# Patient Record
Sex: Female | Born: 1975 | Hispanic: Yes | Marital: Married | Smoking: Former smoker
Health system: Southern US, Community
[De-identification: ages and names within clinical notes are randomized; demographics above are authoritative.]

## PROBLEM LIST (undated history)

## (undated) DIAGNOSIS — H538 Other visual disturbances: Secondary | ICD-10-CM

## (undated) DIAGNOSIS — E041 Nontoxic single thyroid nodule: Secondary | ICD-10-CM

## (undated) DIAGNOSIS — D649 Anemia, unspecified: Secondary | ICD-10-CM

## (undated) DIAGNOSIS — C73 Malignant neoplasm of thyroid gland: Secondary | ICD-10-CM

## (undated) DIAGNOSIS — G43909 Migraine, unspecified, not intractable, without status migrainosus: Secondary | ICD-10-CM

## (undated) HISTORY — DX: Anemia, unspecified: D64.9

## (undated) HISTORY — DX: Other visual disturbances: H53.8

## (undated) HISTORY — DX: Malignant neoplasm of thyroid gland: C73

---

## 2004-02-04 HISTORY — PX: OVARIAN CYST REMOVAL: SHX89

## 2011-11-14 DIAGNOSIS — D649 Anemia, unspecified: Secondary | ICD-10-CM | POA: Insufficient documentation

## 2012-04-30 DIAGNOSIS — D449 Neoplasm of uncertain behavior of unspecified endocrine gland: Secondary | ICD-10-CM | POA: Insufficient documentation

## 2012-10-10 ENCOUNTER — Emergency Department: Payer: Self-pay | Admitting: Emergency Medicine

## 2012-10-10 LAB — URINALYSIS, COMPLETE
Glucose,UR: NEGATIVE mg/dL (ref 0–75)
RBC,UR: 4 /HPF (ref 0–5)
WBC UR: 7 /HPF (ref 0–5)

## 2012-10-10 LAB — CBC
HCT: 40.9 % (ref 35.0–47.0)
HGB: 14.5 g/dL (ref 12.0–16.0)
MCHC: 35.5 g/dL (ref 32.0–36.0)
MCV: 85 fL (ref 80–100)
Platelet: 296 10*3/uL (ref 150–440)
RBC: 4.81 10*6/uL (ref 3.80–5.20)
RDW: 12.7 % (ref 11.5–14.5)

## 2012-10-10 LAB — BASIC METABOLIC PANEL
Anion Gap: 6 — ABNORMAL LOW (ref 7–16)
Calcium, Total: 8.8 mg/dL (ref 8.5–10.1)
EGFR (African American): >60
EGFR (Non-African Amer.): >60
Glucose: 98 mg/dL (ref 65–99)
Osmolality: 275 (ref 275–301)
Potassium: 3.2 mmol/L — ABNORMAL LOW (ref 3.5–5.1)
Sodium: 138 mmol/L (ref 136–145)

## 2012-10-10 LAB — MAGNESIUM: Magnesium: 1.9 mg/dL

## 2015-05-12 ENCOUNTER — Emergency Department: Payer: Self-pay

## 2015-05-12 ENCOUNTER — Emergency Department
Admission: EM | Admit: 2015-05-12 | Discharge: 2015-05-12 | Disposition: A | Discharge status: Home / Self Care | Payer: Self-pay | Attending: Emergency Medicine | Admitting: Emergency Medicine

## 2015-05-12 DIAGNOSIS — R51 Headache: Secondary | ICD-10-CM | POA: Insufficient documentation

## 2015-05-12 DIAGNOSIS — R519 Headache, unspecified: Secondary | ICD-10-CM

## 2015-05-12 LAB — POCT PREGNANCY, URINE: PREG TEST UR: NEGATIVE

## 2015-05-12 MED ORDER — KETOROLAC TROMETHAMINE 30 MG/ML IJ SOLN
30.0000 mg | Freq: Once | INTRAMUSCULAR | Status: AC
  Administered 2015-05-12: 30 mg via INTRAVENOUS

## 2015-05-12 MED ORDER — DIPHENHYDRAMINE HCL 50 MG/ML IJ SOLN
50.0000 mg | Freq: Once | INTRAMUSCULAR | Status: AC
  Administered 2015-05-12: 50 mg via INTRAVENOUS
  Filled 2015-05-12: qty 1

## 2015-05-12 MED ORDER — KETOROLAC TROMETHAMINE 30 MG/ML IJ SOLN
INTRAMUSCULAR | Status: AC
  Administered 2015-05-12: 30 mg via INTRAVENOUS
  Filled 2015-05-12: qty 1

## 2015-05-12 MED ORDER — MORPHINE SULFATE (PF) 2 MG/ML IV SOLN
2.0000 mg | Freq: Once | INTRAVENOUS | Status: AC
  Administered 2015-05-12: 2 mg via INTRAVENOUS
  Filled 2015-05-12: qty 1

## 2015-05-12 MED ORDER — SODIUM CHLORIDE 0.9 % IV BOLUS (SEPSIS)
1000.0000 mL | Freq: Once | INTRAVENOUS | Status: AC
  Administered 2015-05-12: 1000 mL via INTRAVENOUS

## 2015-05-12 MED ORDER — METOCLOPRAMIDE HCL 5 MG/ML IJ SOLN
10.0000 mg | Freq: Once | INTRAMUSCULAR | Status: AC
  Administered 2015-05-12: 10 mg via INTRAVENOUS
  Filled 2015-05-12: qty 2

## 2015-05-12 NOTE — ED Notes (Addendum)
Vomiting and headache that started last night. Denies fever  History of migraines. Also complains of light sensitivity. Tried her own medication with no relief.

## 2015-05-12 NOTE — ED Provider Notes (Signed)
CSN: [id number]     Arrival date & time 05/12/15  1610 History   First MD Initiated Contact with Patient 05/12/15 1650     Chief Complaint  Patient presents with  . Migraine     (Consider location/radiation/quality/duration/timing/severity/associated sxs/prior Treatment) HPI  40 year old female presents to emergency department for evaluation of headache. Patient states headache began several days ago but last night became severe. Patient has a history of migraine headaches. Today's headache is the worse headache she's ever had. Pain is throbbing on the right side of her head. She has photophobia with nausea and vomiting. She has not taken any medications for pain today. She has been treated in the past with headache medications that did not provide her with much relief. She denies any recent trauma or injury. No neck pain or fevers. No vision changes or temporal tenderness.  No past medical history on file. No past surgical history on file. No family history on file. Social History  Substance Use Topics  . Smoking status: Not on file  . Smokeless tobacco: Not on file  . Alcohol Use: Not on file   OB History    No data available     Review of Systems  Constitutional: Negative for fever, chills, activity change and fatigue.  HENT: Negative for congestion, sinus pressure and sore throat.   Eyes: Positive for photophobia. Negative for visual disturbance.  Respiratory: Negative for cough, chest tightness and shortness of breath.   Cardiovascular: Negative for chest pain and leg swelling.  Gastrointestinal: Positive for nausea and vomiting. Negative for abdominal pain and diarrhea.  Genitourinary: Negative for dysuria.  Musculoskeletal: Negative for arthralgias and gait problem.  Skin: Negative for rash.  Neurological: Negative for weakness and numbness.  Hematological: Negative for adenopathy.  Psychiatric/Behavioral: Negative for behavioral problems, confusion and agitation.       Allergies  Review of patient's allergies indicates no known allergies.  Home Medications   Prior to Admission medications   Not on File   BP 102/61 mmHg  Pulse 87  Resp 20  Wt 26.309 kg  SpO2 100% Physical Exam  Constitutional: She is oriented to person, place, and time. She appears well-developed and well-nourished. No distress.  HENT:  Head: Normocephalic and atraumatic.  Mouth/Throat: Oropharynx is clear and moist.  Eyes: EOM are normal. Pupils are equal, round, and reactive to light. Right eye exhibits no discharge. Left eye exhibits no discharge.  Neck: Normal range of motion. Neck supple.  Cardiovascular: Normal rate, regular rhythm and intact distal pulses.   Pulmonary/Chest: Effort normal and breath sounds normal. No respiratory distress. She exhibits no tenderness.  Abdominal: Soft. She exhibits no distension. There is no tenderness.  Musculoskeletal: Normal range of motion. She exhibits no edema.  Neurological: She is alert and oriented to person, place, and time. She has normal reflexes. No cranial nerve deficit. Coordination normal.  Skin: Skin is warm and dry.  Psychiatric: She has a normal mood and affect. Her behavior is normal. Thought content normal.    ED Course  Procedures (including critical care time) Labs Review Labs Reviewed  POC URINE PREG, ED    Imaging Review No results found. I have personally reviewed and evaluated these images and lab results as part of my medical decision-making.   EKG Interpretation None      MDM   Final diagnoses:  Acute nonintractable headache, unspecified headache type    40 year old female with severe migraine headache, worst of her life. CT of the head  negative. Patient given headache cocktail of Toradol, Benadryl, Reglan with 2 mg of morphine. Patient's headache improved significant, pain 0 out of 10 at discharge. Follow-up with neurology, return to the ED for any worsening since urgent changes in her  health.  Duanne Guess, PA-C 05/12/15 [place], MD 05/13/15 [phone]

## 2015-05-12 NOTE — ED Notes (Signed)
Interpreter request submitted for discharge

## 2015-05-12 NOTE — Discharge Instructions (Signed)
Dolor de cabeza general sin causa °(General Headache Without Cause) °El dolor de cabeza es un dolor o malestar que se siente en la zona de la cabeza o del cuello. Hay muchas causas y tipos de dolores de cabeza. En algunos casos, es posible que no se encuentre la causa.  °CUIDADOS EN EL HOGAR  °Control del dolor °· Tome los medicamentos de venta libre y los recetados solamente como se lo haya indicado el médico. °· Cuando sienta dolor de cabeza acuéstese en un cuarto oscuro y tranquilo. °· Si se lo indican, aplique hielo sobre la cabeza y la zona del cuello: °¨ Ponga el hielo en una bolsa plástica. °¨ Coloque una toalla entre la piel y la bolsa de hielo. °¨ Coloque el hielo durante 20 minutos, 2 a 3 veces por día. °· Utilice una almohadilla térmica o tome una ducha con agua caliente para aplicar calor en la cabeza y la zona del cuello como se lo haya indicado el médico. °· Mantenga las luces tenues si le molesta las luces brillantes o sus dolores de cabeza empeoran. °Comida y bebida °· Mantenga un horario para las comidas. °· Beba menos alcohol. °· Consuma menos o deje de tomar cafeína. °Instrucciones generales °· Concurra a todas las visitas de control como se lo haya indicado el médico. Esto es importante. °· Lleve un registro diario para averiguar si ciertas cosas provocan los dolores de cabeza. Por ejemplo, escriba los siguientes datos: °¨ Lo que usted come y bebe. °¨ Cuánto tiempo duerme. °¨ Algún cambio en su dieta o en los medicamentos. °· Realice actividades relajantes, como recibir masajes. °· Disminuya el nivel de estrés. °· Siéntese con la espalda recta. No contraiga (tensione) los músculos. °· No consuma productos que contengan tabaco. Estos incluyen cigarrillos, tabaco para mascar y cigarrillos electrónicos. Si necesita ayuda para dejar de fumar, consulte al médico. °· Haga ejercicios con regularidad tal como se lo indicó el médico. °· Duerma lo suficiente. Esto a menudo significa entre 7 y 9 horas de  sueño. °SOLICITE AYUDA SI: °· Los medicamentos no logran aliviar los síntomas. °· Tiene un dolor de cabeza que es diferente a los otros dolores de cabeza. °· Tiene malestar estomacal (náuseas) o vomita. °· Tiene fiebre. °SOLICITE AYUDA DE INMEDIATO SI:  °· El dolor de cabeza empeora. °· Sigue vomitando. °· Presenta rigidez en el cuello. °· Tiene dificultad para ver. °· Tiene dificultad para hablar. °· Siente dolor en el ojo o en el oído. °· Sus músculos están débiles, o pierde el control muscular. °· Pierde el equilibrio o tiene problemas para caminar. °· Siente que se desvanece (pierde el conocimiento) o se desmaya. °· Se siente confundido. °  °Esta información no tiene como fin reemplazar el consejo del médico. Asegúrese de hacerle al médico cualquier pregunta que tenga. °  °Document Released: 04/14/2011 Document Revised: 10/11/2014 °Elsevier Interactive Patient Education ©2016 Elsevier Inc. ° °

## 2015-05-12 NOTE — ED Notes (Signed)
Interpreter at bedside for assessment at this time.

## 2019-05-01 ENCOUNTER — Ambulatory Visit: Payer: Self-pay | Attending: Internal Medicine

## 2019-05-01 DIAGNOSIS — Z23 Encounter for immunization: Secondary | ICD-10-CM

## 2019-05-01 NOTE — Progress Notes (Signed)
   Covid-19 Vaccination Clinic  Name:  Donna Maynard    MRN: [id number] DOB: 08-31-1975  05/01/2019  Ms. Donna Maynard was observed post Covid-19 immunization for 15 minutes without incident. She was provided with Vaccine Information Sheet and instruction to access the V-Safe system.   Ms. Donna Maynard was instructed to call 911 with any severe reactions post vaccine: Marland Kitchen Difficulty breathing  . Swelling of face and throat  . A fast heartbeat  . A bad rash all over body  . Dizziness and weakness   Immunizations Administered    Name Date Dose VIS Date Route   Pfizer COVID-19 Vaccine 05/01/2019  5:42 PM 0.3 mL 01/14/2019 Intramuscular   Manufacturer: [employer], [employer]   Lot: [id number]   [place]: [id number]

## 2019-05-22 ENCOUNTER — Ambulatory Visit: Payer: Self-pay | Attending: Internal Medicine

## 2019-05-22 DIAGNOSIS — Z23 Encounter for immunization: Secondary | ICD-10-CM

## 2019-05-22 NOTE — Progress Notes (Signed)
   Covid-19 Vaccination Clinic  Name:  Donna Maynard    MRN: [id number] DOB: September 19, 1975  05/22/2019  Ms. Donna Maynard was observed post Covid-19 immunization for 15 minutes without incident. She was provided with Vaccine Information Sheet and instruction to access the V-Safe system.   Ms. Donna Maynard was instructed to call 911 with any severe reactions post vaccine: Marland Kitchen Difficulty breathing  . Swelling of face and throat  . A fast heartbeat  . A bad rash all over body  . Dizziness and weakness   Immunizations Administered    Name Date Dose VIS Date Route   Pfizer COVID-19 Vaccine 05/22/2019  5:36 PM 0.3 mL 01/14/2019 Intramuscular   Manufacturer: [place]   Lot: [id number]   [place]: [id number]

## 2020-05-08 ENCOUNTER — Other Ambulatory Visit: Payer: Self-pay | Admitting: Student in an Organized Health Care Education/Training Program

## 2020-05-08 ENCOUNTER — Other Ambulatory Visit (HOSPITAL_COMMUNITY): Payer: Self-pay | Admitting: Student in an Organized Health Care Education/Training Program

## 2020-05-08 DIAGNOSIS — E041 Nontoxic single thyroid nodule: Secondary | ICD-10-CM

## 2020-05-30 ENCOUNTER — Other Ambulatory Visit: Payer: Self-pay

## 2020-05-30 ENCOUNTER — Ambulatory Visit
Admission: RE | Admit: 2020-05-30 | Discharge: 2020-05-30 | Disposition: A | Discharge status: Home / Self Care | Payer: Self-pay | Source: Ambulatory Visit | Attending: Student in an Organized Health Care Education/Training Program | Admitting: Student in an Organized Health Care Education/Training Program

## 2020-05-30 DIAGNOSIS — E041 Nontoxic single thyroid nodule: Secondary | ICD-10-CM | POA: Insufficient documentation

## 2020-06-29 NOTE — Addendum Note (Signed)
Encounter addended by: Annie Paras on: 06/29/2020 3:49 PM  Actions taken: Letter saved

## 2020-09-18 ENCOUNTER — Other Ambulatory Visit: Payer: Self-pay | Admitting: Otolaryngology

## 2020-09-18 DIAGNOSIS — E041 Nontoxic single thyroid nodule: Secondary | ICD-10-CM

## 2020-09-24 ENCOUNTER — Other Ambulatory Visit: Payer: Self-pay

## 2020-09-24 ENCOUNTER — Ambulatory Visit
Admission: RE | Admit: 2020-09-24 | Discharge: 2020-09-24 | Disposition: A | Discharge status: Home / Self Care | Payer: 59 | Source: Ambulatory Visit | Attending: Otolaryngology | Admitting: Otolaryngology

## 2020-09-24 DIAGNOSIS — E041 Nontoxic single thyroid nodule: Secondary | ICD-10-CM | POA: Insufficient documentation

## 2020-09-24 NOTE — Procedures (Signed)
PROCEDURE SUMMARY:  Successful US guided FNA with 25G x 5 passes of isthmus thyroid nodule. Pt tolerated procedure well. No immediate complications.  Specimen collected and submitted.  EBL < 5 mL  Hedy Jacob PA-C 09/24/2020 1:42 PM

## 2020-10-22 ENCOUNTER — Encounter: Payer: Self-pay | Admitting: Otolaryngology

## 2020-10-22 LAB — CYTOLOGY - NON PAP

## 2021-02-15 ENCOUNTER — Other Ambulatory Visit: Payer: Self-pay

## 2021-02-15 ENCOUNTER — Encounter
Admission: RE | Admit: 2021-02-15 | Discharge: 2021-02-15 | Disposition: A | Discharge status: Home / Self Care | Payer: 59 | Source: Ambulatory Visit | Attending: Otolaryngology | Admitting: Otolaryngology

## 2021-02-15 HISTORY — DX: Nontoxic single thyroid nodule: E04.1

## 2021-02-15 HISTORY — DX: Migraine, unspecified, not intractable, without status migrainosus: G43.909

## 2021-02-15 NOTE — Patient Instructions (Addendum)
Su procedimiento est programado para: martes 24 de enero [place] en el mostrador de Press photographer [place]. Para averiguar su hora de llegada, llame al [phone] entre la 1 p. m. y las 3 p. m. el lunes 23 de enero  RECORDAR: Las instrucciones que no se siguen por completo Heritage manager en un riesgo mdico grave, que puede incluir la [place]; o segn el criterio de su cirujano y Environmental health practitioner, es posible que sea Firefighter su Leisure centre manager.  No coma alimentos despus de la medianoche de la noche anterior a la ciruga. No masticar chicle, pastillas o caramelos duros.  Sin embargo, puede beber lquidos Microsoft 2 horas antes de la fecha prevista para la [place]. No beba nada dentro de las 2 horas de su hora de llegada programada.  Los lquidos claros incluyen: - agua - jugo de manzana sin pulpa - gatorade (no ROJO, MORADO O AZUL) - caf o t solo (NO agregue leche o cremas al caf o t) NO beba nada que no est en esta lista.  NO TOME NINGN MEDICAMENTO LA [place] DE LA CIRUGA  Una semana antes de la ciruga: a partir del 17 de enero Detener los antiinflamatorios (AINE) como Advil, Aleve, Ibuprofen, Motrin, Naproxen, Naprosyn y productos a base de aspirina como Excedrin, Goodys Powder, [employer]. Suspenda CUALQUIER suplemento de venta libre hasta despus de la [place]. Sin embargo, puede Clinical biochemist tomando Tylenol si es necesario para Audiological scientist de la [place].  No alcohol por 24 horas antes o despus de la [place].  No fumar, incluidos los cigarrillos electrnicos, durante las 24 horas previas a la [place]. No productos de tabaco masticables durante al menos 6 horas antes de la [place]. Sin parches de Special educational needs teacher de la [place].  No use ningn medicamento "recreativo" durante al menos una semana antes de su [place]. Tenga en cuenta que la combinacin de cocana y anestesia puede tener resultados negativos, incluso la [place]. Si la  prueba de Education officer, museum positivo, se cancelar la ciruga.  En la maana de la ciruga cepllese los dientes con pasta dental y agua, [place] enjuagarse la boca con enjuague bucal si lo desea. No trague ninguna pasta de dientes o enjuague bucal.  Use CHG Soap como se indica en la hoja de instrucciones.  No use joyas, maquillaje, horquillas para el cabello, clips o esmalte de uas.  No use lociones, talcos ni perfumes.  No se afeite el cuerpo desde el cuello hacia abajo 48 horas antes de la ciruga en caso de que se corte, lo que podra dejar un sitio para la infeccin. Adems, la piel recin afeitada puede irritarse si se [place] el jabn CHG.  No se pueden usar lentes de contacto, audfonos ni dentaduras postizas en la ciruga.  No lleve objetos de valor al hospital. [facility] no se responsabiliza por pertenencias u objetos de valor extraviados o perdidos.  Informe a su mdico si hay algn cambio en su condicin mdica (resfriado, fiebre, infeccin).  Use ropa cmoda (especfica para su tipo de [place]) al hospital.  Despus de la ciruga, puede ayudar a prevenir complicaciones pulmonares haciendo ejercicios de respiracin. Tome respiraciones profundas y tosa cada 1-2 horas. Su mdico puede ordenar un dispositivo llamado espirmetro de incentivo para ayudarlo a respirar profundamente.  Si lo internan en el hospital durante la noche, deje su maleta en el automvil. Despus de la [place], es posible que lo lleven a su habitacin.  Si le dan de  alta el da de la [place], no se le permitir conducir hasta su casa. Necesitar un adulto responsable (mayor de 18 aos) que lo lleve a su casa y se quede con usted esa noche.  Si viaja en transporte pblico, deber ir acompaado de un adulto responsable (mayor de 18 aos). Confirme con su mdico que es aceptable usar el transporte pblico.  Llame al Maryln Manuel de [place]mis[place] al [phone] si tiene alguna pregunta sobre estas  instrucciones.  Poltica de visitas a la ciruga:  Los [employer] se someten a [place] o procedimiento pueden Database administrator o persona de apoyo con ellos, siempre que esa persona no sea COVID-19 positiva o experimente sus sntomas. Esa persona International aid/development worker en la sala de espera durante el procedimiento y puede rotar con Producer, television/film/video.  Visita de pacientes hospitalizados:  El horario de visita es de 7 a. m. a 8 p. m. Se permiten hasta dos visitantes mayores de 16 aos a la vez en la habitacin de un paciente. Los visitantes pueden rotar con Holiday representative. Los visitantes deben registrarse cuando se van, o no se permitirn otros visitantes. [place] persona de apoyo designada puede permanecer durante la noche. El visitante debe pasar las pruebas de deteccin de COVID-19, usar desinfectante para manos al entrar y salir de la habitacin del paciente y usar una mscara en todo momento, incluso en la habitacin del [place]. Los pacientes tambin deben usar una mascarilla cuando el personal o sus visitantes estn en la habitacin. Se requiere enmascaramiento independientemente del estado de vacunacin.   ENGLISH VERSION:  Your procedure is scheduled on: Tuesday, January 24 Report to the Registration Desk on the 1st floor of the [facility]. To find out your arrival time, please call [phone] between 1PM - 3PM on: Monday, January 23  REMEMBER: Instructions that are not followed completely may result in serious medical risk, up to and including death; or upon the discretion of your surgeon and anesthesiologist your surgery may need to be rescheduled.  Do not eat food after midnight the night before surgery.  No gum chewing, lozengers or hard candies.  You may however, drink CLEAR liquids up to 2 hours before you are scheduled to arrive for your surgery. Do not drink anything within 2 hours of your scheduled arrival time.  Clear liquids include: - water  - apple  juice without pulp - gatorade (not RED, PURPLE, OR BLUE) - black coffee or tea (Do NOT add milk or creamers to the coffee or tea) Do NOT drink anything that is not on this list.  DO NOT TAKE ANY MEDICATIONS THE MORNING OF SURGERY   One week prior to surgery: starting January 17 Stop Anti-inflammatories (NSAIDS) such as Advil, Aleve, Ibuprofen, Motrin, Naproxen, Naprosyn and Aspirin based products such as Excedrin, Goodys Powder, BC Powder. Stop ANY OVER THE COUNTER supplements until after surgery. You may however, continue to take Tylenol if needed for pain up until the day of surgery.  No Alcohol for 24 hours before or after surgery.  No Smoking including e-cigarettes for 24 hours prior to surgery.  No chewable tobacco products for at least 6 hours prior to surgery.  No nicotine patches on the day of surgery.  Do not use any "recreational" drugs for at least a week prior to your surgery.  Please be advised that the combination of cocaine and anesthesia may have negative outcomes, up to and including death. If you test positive for cocaine,  your surgery will be cancelled.  On the morning of surgery brush your teeth with toothpaste and water, you may rinse your mouth with mouthwash if you wish. Do not swallow any toothpaste or mouthwash.  Use CHG Soap as directed on instruction sheet.  Do not wear jewelry, make-up, hairpins, clips or nail polish.  Do not wear lotions, powders, or perfumes.   Do not shave body from the neck down 48 hours prior to surgery just in case you cut yourself which could leave a site for infection.  Also, freshly shaved skin may become irritated if using the CHG soap.  Contact lenses, hearing aids and dentures may not be worn into surgery.  Do not bring valuables to the hospital. [facility] is not responsible for any missing/lost belongings or valuables.   Notify your doctor if there is any change in your medical condition (cold, fever,  infection).  Wear comfortable clothing (specific to your surgery type) to the hospital.  After surgery, you can help prevent lung complications by doing breathing exercises.  Take deep breaths and cough every 1-2 hours. Your doctor may order a device called an Incentive Spirometer to help you take deep breaths.  If you are being admitted to the hospital overnight, leave your suitcase in the car. After surgery it may be brought to your room.  If you are being discharged the day of surgery, you will not be allowed to drive home. You will need a responsible adult (18 years or older) to drive you home and stay with you that night.   If you are taking public transportation, you will need to have a responsible adult (18 years or older) with you. Please confirm with your physician that it is acceptable to use public transportation.   Please call the [facility] at [phone] if you have any questions about these instructions.  Surgery Visitation Policy:  Patients undergoing a surgery or procedure may have one family member or support person with them as long as that person is not COVID-19 positive or experiencing its symptoms.  That person may remain in the waiting area during the procedure and may rotate out with other people.  Inpatient Visitation:    Visiting hours are 7 a.m. to 8 p.m. Up to two visitors ages 16+ are allowed at one time in a patient room. The visitors may rotate out with other people during the day. Visitors must check out when they leave, or other visitors will not be allowed. One designated support person may remain overnight. The visitor must pass COVID-19 screenings, use hand sanitizer when entering and exiting the patients room and wear a mask at all times, including in the patients room. Patients must also wear a mask when staff or their visitor are in the room. Masking is required regardless of vaccination status.

## 2021-02-22 ENCOUNTER — Other Ambulatory Visit: Payer: Self-pay

## 2021-02-22 ENCOUNTER — Other Ambulatory Visit
Admission: RE | Admit: 2021-02-22 | Discharge: 2021-02-22 | Disposition: A | Discharge status: Home / Self Care | Payer: 59 | Source: Ambulatory Visit | Attending: Otolaryngology | Admitting: Otolaryngology

## 2021-02-22 DIAGNOSIS — Z01812 Encounter for preprocedural laboratory examination: Secondary | ICD-10-CM | POA: Diagnosis present

## 2021-02-22 DIAGNOSIS — Z20822 Contact with and (suspected) exposure to covid-19: Secondary | ICD-10-CM | POA: Diagnosis not present

## 2021-02-22 LAB — SARS CORONAVIRUS 2 (TAT 6-24 HRS): SARS Coronavirus 2: NEGATIVE

## 2021-02-26 ENCOUNTER — Ambulatory Visit
Admission: RE | Admit: 2021-02-26 | Discharge: 2021-02-26 | Disposition: A | Discharge status: Home / Self Care | Payer: 59 | Attending: Otolaryngology | Admitting: Otolaryngology

## 2021-02-26 ENCOUNTER — Ambulatory Visit: Payer: 59

## 2021-02-26 ENCOUNTER — Encounter
Admission: RE | Disposition: A | Discharge status: Home / Self Care | Payer: Self-pay | Source: Home / Self Care | Attending: Otolaryngology

## 2021-02-26 ENCOUNTER — Other Ambulatory Visit: Payer: Self-pay

## 2021-02-26 ENCOUNTER — Encounter: Payer: Self-pay | Admitting: Otolaryngology

## 2021-02-26 DIAGNOSIS — C73 Malignant neoplasm of thyroid gland: Secondary | ICD-10-CM | POA: Diagnosis present

## 2021-02-26 DIAGNOSIS — Z87891 Personal history of nicotine dependence: Secondary | ICD-10-CM | POA: Diagnosis not present

## 2021-02-26 HISTORY — PX: THYROIDECTOMY: SHX17

## 2021-02-26 LAB — POCT PREGNANCY, URINE: Preg Test, Ur: NEGATIVE

## 2021-02-26 SURGERY — THYROIDECTOMY
Anesthesia: General | Laterality: Right

## 2021-02-26 MED ORDER — FAMOTIDINE 20 MG PO TABS: 20.0000 mg | ORAL_TABLET | Freq: Once | ORAL | Status: DC

## 2021-02-26 MED ORDER — FENTANYL CITRATE (PF) 100 MCG/2ML IJ SOLN
INTRAMUSCULAR | Status: DC | PRN
  Administered 2021-02-26 (×2): 50 ug via INTRAVENOUS

## 2021-02-26 MED ORDER — OXYCODONE HCL 5 MG/5ML PO SOLN
ORAL | Status: AC
  Filled 2021-02-26: qty 5

## 2021-02-26 MED ORDER — BACITRACIN ZINC 500 UNIT/GM EX OINT
TOPICAL_OINTMENT | CUTANEOUS | Status: AC
  Filled 2021-02-26: qty 28.35

## 2021-02-26 MED ORDER — PROMETHAZINE HCL 12.5 MG PO TABS
12.5000 mg | ORAL_TABLET | Freq: Four times a day (QID) | ORAL | 0 refills | Status: AC | PRN
Start: 2021-02-26 — End: ?

## 2021-02-26 MED ORDER — SUCCINYLCHOLINE CHLORIDE 200 MG/10ML IV SOSY
PREFILLED_SYRINGE | INTRAVENOUS | Status: DC | PRN
  Administered 2021-02-26: 100 mg via INTRAVENOUS

## 2021-02-26 MED ORDER — ONDANSETRON HCL 4 MG/2ML IJ SOLN
INTRAMUSCULAR | Status: DC | PRN
  Administered 2021-02-26: 4 mg via INTRAVENOUS

## 2021-02-26 MED ORDER — FENTANYL CITRATE (PF) 100 MCG/2ML IJ SOLN
25.0000 ug | INTRAMUSCULAR | Status: DC | PRN
  Administered 2021-02-26: 10:00:00 25 ug via INTRAVENOUS

## 2021-02-26 MED ORDER — ORAL CARE MOUTH RINSE
15.0000 mL | Freq: Once | OROMUCOSAL | Status: DC
Start: 2021-02-26 — End: 2021-02-26

## 2021-02-26 MED ORDER — PHENYLEPHRINE HCL (PRESSORS) 10 MG/ML IV SOLN
INTRAVENOUS | Status: DC | PRN
  Administered 2021-02-26 (×4): 100 ug via INTRAVENOUS

## 2021-02-26 MED ORDER — DEXMEDETOMIDINE (PRECEDEX) IN NS 20 MCG/5ML (4 MCG/ML) IV SYRINGE
PREFILLED_SYRINGE | INTRAVENOUS | Status: AC
  Filled 2021-02-26: qty 5

## 2021-02-26 MED ORDER — PROPOFOL 10 MG/ML IV BOLUS
INTRAVENOUS | Status: DC | PRN
  Administered 2021-02-26: 150 mg via INTRAVENOUS

## 2021-02-26 MED ORDER — MIDAZOLAM HCL 2 MG/2ML IJ SOLN
INTRAMUSCULAR | Status: DC | PRN
  Administered 2021-02-26: 2 mg via INTRAVENOUS

## 2021-02-26 MED ORDER — OXYCODONE HCL 5 MG/5ML PO SOLN
5.0000 mg | Freq: Once | ORAL | Status: AC | PRN
  Administered 2021-02-26: 12:00:00 5 mg via ORAL

## 2021-02-26 MED ORDER — CHLORHEXIDINE GLUCONATE 0.12 % MT SOLN: 15.0000 mL | Freq: Once | OROMUCOSAL | Status: DC

## 2021-02-26 MED ORDER — SUCCINYLCHOLINE CHLORIDE 200 MG/10ML IV SOSY
PREFILLED_SYRINGE | INTRAVENOUS | Status: AC
  Filled 2021-02-26: qty 10

## 2021-02-26 MED ORDER — LIDOCAINE HCL URETHRAL/MUCOSAL 2 % EX GEL
CUTANEOUS | Status: AC
  Filled 2021-02-26: qty 5

## 2021-02-26 MED ORDER — ONDANSETRON HCL 4 MG/2ML IJ SOLN
INTRAMUSCULAR | Status: AC
  Filled 2021-02-26: qty 2

## 2021-02-26 MED ORDER — FENTANYL CITRATE (PF) 100 MCG/2ML IJ SOLN
INTRAMUSCULAR | Status: AC
  Administered 2021-02-26: 10:00:00 25 ug via INTRAVENOUS
  Filled 2021-02-26: qty 2

## 2021-02-26 MED ORDER — OXYCODONE HCL 5 MG PO TABS: 5.0000 mg | ORAL_TABLET | Freq: Once | ORAL | Status: AC | PRN

## 2021-02-26 MED ORDER — ACETAMINOPHEN 10 MG/ML IV SOLN
INTRAVENOUS | Status: AC
  Administered 2021-02-26: 14:00:00 1000 mg via INTRAVENOUS
  Filled 2021-02-26: qty 100

## 2021-02-26 MED ORDER — LIDOCAINE HCL (CARDIAC) PF 100 MG/5ML IV SOSY
PREFILLED_SYRINGE | INTRAVENOUS | Status: DC | PRN
  Administered 2021-02-26: 60 mg via INTRAVENOUS

## 2021-02-26 MED ORDER — DEXAMETHASONE SODIUM PHOSPHATE 10 MG/ML IJ SOLN
INTRAMUSCULAR | Status: AC
  Filled 2021-02-26: qty 1

## 2021-02-26 MED ORDER — SEVOFLURANE IN SOLN
RESPIRATORY_TRACT | Status: AC
  Filled 2021-02-26: qty 250

## 2021-02-26 MED ORDER — HYDROCODONE-ACETAMINOPHEN 5-325 MG PO TABS: 1.0000 | ORAL_TABLET | ORAL | 0 refills | Status: DC | PRN

## 2021-02-26 MED ORDER — HYDROMORPHONE HCL 1 MG/ML IJ SOLN
INTRAMUSCULAR | Status: AC
  Filled 2021-02-26: qty 1

## 2021-02-26 MED ORDER — DEXAMETHASONE SODIUM PHOSPHATE 10 MG/ML IJ SOLN
INTRAMUSCULAR | Status: DC | PRN
  Administered 2021-02-26: 8 mg via INTRAVENOUS

## 2021-02-26 MED ORDER — DEXMEDETOMIDINE (PRECEDEX) IN NS 20 MCG/5ML (4 MCG/ML) IV SYRINGE
PREFILLED_SYRINGE | INTRAVENOUS | Status: DC | PRN
  Administered 2021-02-26: 2 ug via INTRAVENOUS
  Administered 2021-02-26: 4 ug via INTRAVENOUS

## 2021-02-26 MED ORDER — LACTATED RINGERS IV SOLN: INTRAVENOUS | Status: DC

## 2021-02-26 MED ORDER — PROPOFOL 10 MG/ML IV BOLUS
INTRAVENOUS | Status: AC
  Filled 2021-02-26: qty 20

## 2021-02-26 MED ORDER — PHENYLEPHRINE HCL-NACL 20-0.9 MG/250ML-% IV SOLN
INTRAVENOUS | Status: DC | PRN
  Administered 2021-02-26: 20 ug/min via INTRAVENOUS

## 2021-02-26 MED ORDER — LIDOCAINE HCL (PF) 2 % IJ SOLN
INTRAMUSCULAR | Status: AC
  Filled 2021-02-26: qty 5

## 2021-02-26 MED ORDER — OXYCODONE HCL 5 MG/5ML PO SOLN: 5.0000 mg | Freq: Four times a day (QID) | ORAL | 0 refills | Status: AC | PRN

## 2021-02-26 MED ORDER — BUPIVACAINE-EPINEPHRINE (PF) 0.25% -1:200000 IJ SOLN
INTRAMUSCULAR | Status: AC
  Filled 2021-02-26: qty 30

## 2021-02-26 MED ORDER — 0.9 % SODIUM CHLORIDE (POUR BTL) OPTIME
TOPICAL | Status: DC | PRN
  Administered 2021-02-26: 09:00:00 500 mL

## 2021-02-26 MED ORDER — ONDANSETRON HCL 4 MG PO TABS: 4.0000 mg | ORAL_TABLET | Freq: Three times a day (TID) | ORAL | 0 refills | Status: AC | PRN

## 2021-02-26 MED ORDER — ACETAMINOPHEN 10 MG/ML IV SOLN: 1000.0000 mg | Freq: Once | INTRAVENOUS | Status: DC | PRN

## 2021-02-26 MED ORDER — PHENYLEPHRINE HCL (PRESSORS) 10 MG/ML IV SOLN
INTRAVENOUS | Status: AC
  Filled 2021-02-26: qty 1

## 2021-02-26 MED ORDER — PROMETHAZINE HCL 25 MG/ML IJ SOLN
INTRAMUSCULAR | Status: AC
  Administered 2021-02-26: 10:00:00 6.25 mg via INTRAVENOUS
  Filled 2021-02-26: qty 1

## 2021-02-26 MED ORDER — MIDAZOLAM HCL 2 MG/2ML IJ SOLN
INTRAMUSCULAR | Status: AC
  Filled 2021-02-26: qty 2

## 2021-02-26 MED ORDER — HYDROMORPHONE HCL 1 MG/ML IJ SOLN
INTRAMUSCULAR | Status: DC | PRN
  Administered 2021-02-26: .5 mg via INTRAVENOUS

## 2021-02-26 MED ORDER — PROMETHAZINE HCL 25 MG/ML IJ SOLN: 6.2500 mg | Freq: Once | INTRAMUSCULAR | Status: AC

## 2021-02-26 MED ORDER — ONDANSETRON HCL 4 MG/2ML IJ SOLN
4.0000 mg | Freq: Once | INTRAMUSCULAR | Status: AC | PRN
  Administered 2021-02-26: 09:00:00 4 mg via INTRAVENOUS

## 2021-02-26 MED ORDER — FENTANYL CITRATE (PF) 100 MCG/2ML IJ SOLN
INTRAMUSCULAR | Status: AC
  Filled 2021-02-26: qty 2

## 2021-02-26 MED ORDER — BUPIVACAINE-EPINEPHRINE (PF) 0.25% -1:200000 IJ SOLN
INTRAMUSCULAR | Status: DC | PRN
  Administered 2021-02-26: 7.5 mL

## 2021-02-26 SURGICAL SUPPLY — 47 items
BLADE SURG 15 STRL LF DISP TIS (BLADE) ×1 IMPLANT
BLADE SURG 15 STRL SS (BLADE) ×2
BULB RESERV EVAC DRAIN JP 100C (MISCELLANEOUS) IMPLANT
CLOSURE WOUND 1/4X4 (GAUZE/BANDAGES/DRESSINGS) ×1
CORD BIP STRL DISP 12FT (MISCELLANEOUS) ×3 IMPLANT
DERMABOND ADVANCED (GAUZE/BANDAGES/DRESSINGS)
DERMABOND ADVANCED .7 DNX12 (GAUZE/BANDAGES/DRESSINGS) IMPLANT
DRAIN JP 10F RND SILICONE (MISCELLANEOUS) IMPLANT
DRAPE MAG INST 16X20 L/F (DRAPES) ×3 IMPLANT
DRSG TEGADERM 2-3/8X2-3/4 SM (GAUZE/BANDAGES/DRESSINGS) ×2 IMPLANT
ELECT CAUTERY BLADE TIP 2.5 (TIP) ×3
ELECT LARYNGEAL 6/7 (MISCELLANEOUS)
ELECT LARYNGEAL 8/9 (MISCELLANEOUS)
ELECT NEEDLE 20X.3 GREEN (MISCELLANEOUS) ×3
ELECT REM PT RETURN 9FT ADLT (ELECTROSURGICAL) ×3
ELECTRODE CAUTERY BLDE TIP 2.5 (TIP) ×1 IMPLANT
ELECTRODE LARYNGEAL 6/7 (MISCELLANEOUS) IMPLANT
ELECTRODE LARYNGEAL 8/9 (MISCELLANEOUS) IMPLANT
ELECTRODE NDL 20X.3 GREEN (MISCELLANEOUS) IMPLANT
ELECTRODE NEEDLE 20X.3 GREEN (MISCELLANEOUS) ×1 IMPLANT
ELECTRODE REM PT RTRN 9FT ADLT (ELECTROSURGICAL) ×1 IMPLANT
FORCEPS JEWEL BIP 4-3/4 STR (INSTRUMENTS) ×3 IMPLANT
GAUZE 4X4 16PLY ~~LOC~~+RFID DBL (SPONGE) ×1 IMPLANT
GLOVE SURG ENC MOIS LTX SZ7.5 (GLOVE) ×6 IMPLANT
GOWN STRL REUS W/ TWL LRG LVL3 (GOWN DISPOSABLE) ×3 IMPLANT
GOWN STRL REUS W/TWL LRG LVL3 (GOWN DISPOSABLE) ×6
HEMOSTAT SURGICEL 2X3 (HEMOSTASIS) ×3 IMPLANT
HOOK STAY BLUNT/RETRACTOR 5M (MISCELLANEOUS) IMPLANT
KIT TURNOVER KIT A (KITS) ×3 IMPLANT
LABEL OR SOLS (LABEL) ×3 IMPLANT
MANIFOLD NEPTUNE II (INSTRUMENTS) ×3 IMPLANT
NS IRRIG 500ML POUR BTL (IV SOLUTION) ×3 IMPLANT
PACK HEAD/NECK (MISCELLANEOUS) ×3 IMPLANT
PROBE NEUROSIGN BIPOL (MISCELLANEOUS) ×1 IMPLANT
PROBE NEUROSIGN BIPOLAR (MISCELLANEOUS) ×2
SHEARS HARMONIC 9CM CVD (BLADE) ×3 IMPLANT
SOL PREP PVP 2OZ (MISCELLANEOUS) ×3
SOLUTION PREP PVP 2OZ (MISCELLANEOUS) ×1 IMPLANT
SPONGE KITTNER 5P (MISCELLANEOUS) ×3 IMPLANT
STRIP CLOSURE SKIN 1/4X4 (GAUZE/BANDAGES/DRESSINGS) ×1 IMPLANT
SUT PROLENE 6 0 P 1 18 (SUTURE) ×3 IMPLANT
SUT SILK 2 0 (SUTURE) ×2
SUT SILK 2 0 SH (SUTURE) ×3 IMPLANT
SUT SILK 2-0 18XBRD TIE 12 (SUTURE) ×1 IMPLANT
SUT VIC AB 4-0 RB1 18 (SUTURE) ×3 IMPLANT
SYSTEM CHEST DRAIN TLS 7FR (DRAIN) IMPLANT
WATER STERILE IRR 500ML POUR (IV SOLUTION) ×1 IMPLANT

## 2021-02-26 NOTE — Op Note (Signed)
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  Previously followed at [facility].  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  Findings: Right isthmus nodule firm.  Multiple right sided nodules.  Right RLN identified and preserved with robust stimulation at conclusion of procedure.  Right inferior parathyroid gland identified and preserved.  Description of Procedure:  After the patient was identified in holding and the history and physical and consent was reviewed and updated.  The patient was marked in an upright position on the right side along a natural occuring skin crease.  The patient was next taken to the operating room and placed in a supine position.  General endotracheal anesthesia was induced with laryngeal monitor endotracheal tube.  Direct visualization by the surgeon of the tube electrodes in contact with the vocal cords was made..  The patient's anterior marked neck crease was neck injected with 7.5cc's of 0.25% marcaine with 1:200,000 Epinephrine.  The patient was next prepped and draped in a sterile normal fashion.  At this time, a 15 blade scalpel was used to make a skin incision along a previously marked anterior neck crease.  Dissection was carefully performed through the subcutaneous tissues with combination of Bovie electrocautery and blunt dissection.  The platysma was incised and anterior neck veins ligated with harmonic scalpel.  The median raphe of the strap muscles was divided in a linear fashion with Bovie electrocautery until the anterior border of the thyroid gland was identified.  The sternohyoid muscle was bluntly dissected away  from the right hemithyroid gland.  The lateral border of the thyroid and the carotid artery was identified.  Dissection bluntly and with Bovie electrocautery was continued superiorly and inferiorly along the lateral edge of the thyroid.  The superior vessels were identified laterally and then medially with blunt dissection in Joel's space between the larynx and the superior thyroid pole.  Further dissection was continued inferiorly as well.  Once the superior pole was pedicled, the superior thyroid vessels were ligated with Harmonic Scalpel.  Berry's ligament and the nodule of Zuckerkandl were identified.  Just beneath this nodule, the recurrent laryngeal nerve was identified and stimulated with movement of the arytenoid joint and nerve stimulator.  The inferior parathyroid was identified adjacent to the nerve along with the superior parathyroid as well.  These were identified and revealed good vascularization.  The nerve was tracked until its insertion into the larynx.  Next, the remaining attachments of Berry's ligament was divided.  A nodule within the right isthmus was identified.  Using Bipolar forceps the isthmus was dissected from the trachea past the isthmus nodule.  Next the isthmus was divided with Harmonic Scalpel.  The wound was copiously irrigated with sterile saline.  Meticulous hemostasis with bipolar was obtained.  Visualization of an intact left recurrent laryngeal nerve that stimulated was made.  The parathyroid glands were intact and well perfused.  Surgicel was placed in the wound bed.  The wound was then closed in a multilayered fashion with vicryl for subcutaneous tissues and Dermabond for cutaneous closure.  At this time the patient was extubated and taken to PACU in good condition.  Plan:  Follow  pathology.  Limit activity for 2 weeks.  Follow up next week for post-operative evaluation.  Jeannie Fend Ranita Stjulien  02/26/2021 8:59 AM

## 2021-02-26 NOTE — Anesthesia Postprocedure Evaluation (Signed)
Anesthesia Post Note  Patient: Donna Maynard  Procedure(s) Performed: HEMITHYROIDECTOMY WITH LARYNGEAL NERVE MONITORING (Right)  Patient location during evaluation: PACU Anesthesia Type: General Level of consciousness: awake and alert, oriented and patient cooperative Pain management: pain level controlled Vital Signs Assessment: post-procedure vital signs reviewed and stable Respiratory status: spontaneous breathing, nonlabored ventilation and respiratory function stable Cardiovascular status: blood pressure returned to baseline and stable Postop Assessment: adequate PO intake Anesthetic complications: no   No notable events documented.   Last Vitals:  Vitals:   02/26/21 1123 02/26/21 1200  BP: 119/66 119/71  Pulse: 89 72  Resp: 18 16  Temp: 36.5 C 36.7 C  SpO2: 100% 100%    Last Pain:  Vitals:   02/26/21 1200  TempSrc: Temporal  PainSc:                  Darrin Nipper

## 2021-02-26 NOTE — Progress Notes (Signed)
Patient has had quite a bit of pain, it did improve a little after liquid oral oxycodone and IV Tylenol.  Notified Dr. Pryor Ochoa patient was still having some pain but was ok with going home, but asked for liquid pain medication at home.  He acknowledged and said she would have some pain, and told me to void the Norco and he electronically sent liquid oxycodone to her pharmacy.  Pain is down to a 4 at the time of discharge.  Discharge instructions were reviewed with the patient and husband.

## 2021-02-26 NOTE — Anesthesia Procedure Notes (Signed)
Procedure Name: Intubation Date/Time: 02/26/2021 7:27 AM Performed by: Bea Graff, RN Pre-anesthesia Checklist: Patient identified, Emergency Drugs available, Suction available and Patient being monitored Patient Re-evaluated:Patient Re-evaluated prior to induction Oxygen Delivery Method: Circle system utilized Preoxygenation: Pre-oxygenation with 100% oxygen Induction Type: IV induction Ventilation: Mask ventilation without difficulty Laryngoscope Size: McGraph and 3 Grade View: Grade I Tube type: Oral (nerve monitoring tape applied) Tube size: 7.0 mm Number of attempts: 1 Airway Equipment and Method: Stylet and Oral airway Placement Confirmation: ETT inserted through vocal cords under direct vision, positive ETCO2 and breath sounds checked- equal and bilateral Secured at: 20 cm Tube secured with: Tape Dental Injury: Teeth and Oropharynx as per pre-operative assessment

## 2021-02-26 NOTE — Progress Notes (Signed)
°   02/26/21 0645  Clinical Encounter Type  Visited With Patient  Visit Type Pre-op;Social support   Chaplain provided pre-op support through conversation in [place] as the patient does not speak [place]

## 2021-02-26 NOTE — Discharge Instructions (Signed)
AMBULATORY SURGERY  ?DISCHARGE INSTRUCTIONS ? ? ?The drugs that you were given will stay in your system until tomorrow so for the next 24 hours you should not: ? ?Drive an automobile ?Make any legal decisions ?Drink any alcoholic beverage ? ? ?You may resume regular meals tomorrow.  Today it is better to start with liquids and gradually work up to solid foods. ? ?You may eat anything you prefer, but it is better to start with liquids, then soup and crackers, and gradually work up to solid foods. ? ? ?Please notify your doctor immediately if you have any unusual bleeding, trouble breathing, redness and pain at the surgery site, drainage, fever, or pain not relieved by medication. ? ? ? ?Additional Instructions: ? ? ? ?Please contact your physician with any problems or Same Day Surgery at [phone], Monday through Friday 6 am to 4 pm, or [place] [facility] number at [phone].  ?

## 2021-02-26 NOTE — Progress Notes (Signed)
PACU note: pt responds to voice. Interpreter used to assess pain and orient to situation. Pt cont to have vomiting even after zofran x 2.  Phenergan given per order.

## 2021-02-26 NOTE — Transfer of Care (Signed)
Immediate Anesthesia Transfer of Care Note  Patient: Shanyiah Conde [facility]  Procedure(s) Performed: HEMITHYROIDECTOMY WITH LARYNGEAL NERVE MONITORING (Right)  Patient Location: PACU  Anesthesia Type:General  Level of Consciousness: drowsy  Airway & Oxygen Therapy: Patient Spontanous Breathing and Patient connected to face mask oxygen  Post-op Assessment: Report given to RN and Post -op Vital signs reviewed and stable  Post vital signs: Reviewed and stable  Last Vitals:  Vitals Value Taken Time  BP 126/70 02/26/21 0904  Temp    Pulse 85 02/26/21 0906  Resp 21 02/26/21 0906  SpO2 100 % 02/26/21 0906  Vitals shown include unvalidated device data.  Last Pain:  Vitals:   02/26/21 0634  TempSrc: Temporal  PainSc: 0-No pain         Complications: No notable events documented.

## 2021-02-26 NOTE — Anesthesia Preprocedure Evaluation (Addendum)
Anesthesia Evaluation  Patient identified by MRN, date of birth, ID band Patient awake    Reviewed: Allergy & Precautions, NPO status , Patient's Chart, lab work & pertinent test results  History of Anesthesia Complications Negative for: history of anesthetic complications  Airway Mallampati: II   Neck ROM: Full    Dental no notable dental hx.    Pulmonary former smoker (quit greater than 20 years ago),    Pulmonary exam normal breath sounds clear to auscultation       Cardiovascular Exercise Tolerance: Good negative cardio ROS Normal cardiovascular exam Rhythm:Regular Rate:Normal     Neuro/Psych  Headaches,    GI/Hepatic negative GI ROS,   Endo/Other  Thyroid nodule  Renal/GU negative Renal ROS     Musculoskeletal   Abdominal   Peds  Hematology negative hematology ROS (+)   Anesthesia Other Findings   Reproductive/Obstetrics                            Anesthesia Physical Anesthesia Plan  ASA: 3  Anesthesia Plan: General   Post-op Pain Management:    Induction: Intravenous  PONV Risk Score and Plan: 3 and Ondansetron, Dexamethasone and Treatment may vary due to age or medical condition  Airway Management Planned: Oral ETT  Additional Equipment:   Intra-op Plan:   Post-operative Plan: Extubation in OR  Informed Consent: I have reviewed the patients History and Physical, chart, labs and discussed the procedure including the risks, benefits and alternatives for the proposed anesthesia with the patient or authorized representative who has indicated his/her understanding and acceptance.     Dental advisory given  Plan Discussed with: CRNA  Anesthesia Plan Comments: (History and consent obtained in Spanish.  Patient consented for risks of anesthesia including but not limited to:  - adverse reactions to medications - damage to eyes, teeth, lips or other oral mucosa - nerve  damage due to positioning  - sore throat or hoarseness - damage to heart, brain, nerves, lungs, other parts of body or loss of life  Informed patient about role of CRNA in peri- and intra-operative care.  Patient voiced understanding.)       Anesthesia Quick Evaluation

## 2021-02-26 NOTE — H&P (Signed)
..  History and Physical paper copy reviewed and updated date of procedure and will be scanned into system.  Patient seen and examined and right neck marked.  Discussed through use of translator.

## 2021-02-27 ENCOUNTER — Encounter: Payer: Self-pay | Admitting: Otolaryngology

## 2021-02-27 LAB — SURGICAL PATHOLOGY

## 2021-03-04 ENCOUNTER — Other Ambulatory Visit: Payer: Self-pay | Admitting: Otolaryngology

## 2021-03-07 ENCOUNTER — Other Ambulatory Visit: Payer: 59

## 2021-03-07 ENCOUNTER — Other Ambulatory Visit: Payer: Self-pay | Admitting: Otolaryngology

## 2021-03-07 NOTE — Progress Notes (Signed)
Tumor Board Documentation  Jayce Boyko was presented by Dr Pryor Ochoa at our Tumor Board on 03/07/2021, which included representatives from medical oncology, radiation oncology, radiology, pathology, surgical, pharmacy, pulmonology, genetics, research, navigation, palliative care, internal medicine.  Alexy currently presents for [facility], for new positive pathology, as a new patient with history of the following treatments: surgical intervention(s).  Additionally, we reviewed previous medical and familial history, history of present illness, and recent lab results along with all available histopathologic and imaging studies. The tumor board considered available treatment options and made the following recommendations:   Pt is referred to Medical Oncology and will see Dr Janese Banks  03/13/21, Dr Pryor Ochoa will discuss with patient about having a completion thyroidectomy  The following procedures/referrals were also placed: No orders of the defined types were placed in this encounter.   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  Tumor board is a meeting of clinicians from various specialty areas who evaluate and discuss patients for whom a multidisciplinary approach is being considered. Final determinations in the plan of care are those of the provider(s). The responsibility for follow up of recommendations given during tumor board is that of the provider.   Todays extended care, comprehensive team conference, Farha was not present for the discussion and was not examined.   Multidisciplinary Tumor Board is a multidisciplinary case peer review process.  Decisions discussed in the Multidisciplinary Tumor Board reflect the opinions of the specialists present at the conference without having examined  the patient.  Ultimately, treatment and diagnostic decisions rest with the primary provider(s) and the patient.

## 2021-03-08 ENCOUNTER — Encounter: Payer: Self-pay | Admitting: *Deleted

## 2021-03-13 ENCOUNTER — Other Ambulatory Visit: Payer: Self-pay

## 2021-03-13 ENCOUNTER — Inpatient Hospital Stay: Payer: 59

## 2021-03-13 ENCOUNTER — Inpatient Hospital Stay: Payer: 59 | Attending: Oncology | Admitting: Oncology

## 2021-03-13 ENCOUNTER — Encounter: Payer: Self-pay | Admitting: Oncology

## 2021-03-13 ENCOUNTER — Other Ambulatory Visit: Payer: Self-pay | Admitting: Otolaryngology

## 2021-03-13 ENCOUNTER — Ambulatory Visit
Admission: RE | Admit: 2021-03-13 | Discharge: 2021-03-13 | Disposition: A | Discharge status: Home / Self Care | Payer: 59 | Source: Ambulatory Visit | Attending: Otolaryngology | Admitting: Otolaryngology

## 2021-03-13 ENCOUNTER — Encounter (INDEPENDENT_AMBULATORY_CARE_PROVIDER_SITE_OTHER): Payer: Self-pay

## 2021-03-13 ENCOUNTER — Ambulatory Visit
Admission: RE | Admit: 2021-03-13 | Discharge: 2021-03-13 | Disposition: A | Discharge status: Home / Self Care | Payer: 59 | Attending: Otolaryngology | Admitting: Otolaryngology

## 2021-03-13 VITALS — BP 105/69 | HR 84 | Temp 97.7°F | Resp 16 | Ht 65.5 in | Wt 147.0 lb

## 2021-03-13 DIAGNOSIS — C73 Malignant neoplasm of thyroid gland: Secondary | ICD-10-CM

## 2021-03-13 DIAGNOSIS — R059 Cough, unspecified: Secondary | ICD-10-CM

## 2021-03-13 DIAGNOSIS — Z9889 Other specified postprocedural states: Secondary | ICD-10-CM | POA: Diagnosis present

## 2021-03-13 DIAGNOSIS — Z7189 Other specified counseling: Secondary | ICD-10-CM | POA: Diagnosis not present

## 2021-03-13 LAB — COMPREHENSIVE METABOLIC PANEL
ALT: 40 U/L (ref 0–44)
AST: 16 U/L (ref 15–41)
Albumin: 3.9 g/dL (ref 3.5–5.0)
Alkaline Phosphatase: 71 U/L (ref 38–126)
Anion gap: 9 (ref 5–15)
BUN: 12 mg/dL (ref 6–20)
CO2: 26 mmol/L (ref 22–32)
Calcium: 8.9 mg/dL (ref 8.9–10.3)
Chloride: 100 mmol/L (ref 98–111)
Creatinine, Ser: 0.66 mg/dL (ref 0.44–1.00)
GFR, Estimated: >60 mL/min (ref >60)
Glucose, Bld: 96 mg/dL (ref 70–99)
Potassium: 3.6 mmol/L (ref 3.5–5.1)
Sodium: 135 mmol/L (ref 135–145)
Total Bilirubin: 0.8 mg/dL (ref 0.3–1.2)
Total Protein: 7.7 g/dL (ref 6.5–8.1)

## 2021-03-13 LAB — CBC WITH DIFFERENTIAL/PLATELET
Abs Immature Granulocytes: 0.15 10*3/uL — ABNORMAL HIGH (ref 0.00–0.07)
Basophils Absolute: 0 10*3/uL (ref 0.0–0.1)
Basophils Relative: 0 %
Eosinophils Absolute: 0.1 10*3/uL (ref 0.0–0.5)
Eosinophils Relative: 1 %
HCT: 43.3 % (ref 36.0–46.0)
Hemoglobin: 14.6 g/dL (ref 12.0–15.0)
Immature Granulocytes: 1 %
Lymphocytes Relative: 17 %
Lymphs Abs: 2 10*3/uL (ref 0.7–4.0)
MCH: 29.4 pg (ref 26.0–34.0)
MCHC: 33.7 g/dL (ref 30.0–36.0)
MCV: 87.3 fL (ref 80.0–100.0)
Monocytes Absolute: 0.8 10*3/uL (ref 0.1–1.0)
Monocytes Relative: 7 %
Neutro Abs: 8.4 10*3/uL — ABNORMAL HIGH (ref 1.7–7.7)
Neutrophils Relative %: 74 %
Platelets: 437 10*3/uL — ABNORMAL HIGH (ref 150–400)
RBC: 4.96 MIL/uL (ref 3.87–5.11)
RDW: 12.6 % (ref 11.5–15.5)
WBC: 11.4 10*3/uL — ABNORMAL HIGH (ref 4.0–10.5)
nRBC: 0 % (ref 0.0–0.2)

## 2021-03-13 LAB — TSH: TSH: 2.296 u[IU]/mL (ref 0.350–4.500)

## 2021-03-14 ENCOUNTER — Encounter: Payer: Self-pay | Admitting: Oncology

## 2021-03-14 DIAGNOSIS — Z7189 Other specified counseling: Secondary | ICD-10-CM | POA: Insufficient documentation

## 2021-03-14 DIAGNOSIS — C73 Malignant neoplasm of thyroid gland: Secondary | ICD-10-CM | POA: Insufficient documentation

## 2021-03-14 NOTE — Progress Notes (Signed)
Hematology/Oncology Consult note [facility] Telephone:([phone] [phone]  Patient Care Team: Doreene Nest, MD as PCP - General Carloyn Manner, MD as Referring Physician (Otolaryngology) Sindy Guadeloupe, MD as Consulting Physician (Oncology)   Name of the patient: Donna Maynard  [id number]  [birth year]    Reason for referral-newly diagnosed follicular carcinoma of the thyroid   Referring physician-Dr. Pryor Ochoa  Date of visit: 03/14/21   History of presenting illness- Patient is a 46 year old Hispanic female who was referred to Dr. Pryor Ochoa for thyroid nodule.  History obtained with the help of Spanish interpreter she subsequently had an ultrasound-guided biopsy of the nodule in August 2022 which was consistent with follicular lesion of undetermined significance bedside a category 3.  She was also followed at [facility].  Patient subsequently underwent lobectomy by Dr. Pryor Ochoa on 02/26/2021.  Final pathology showed follicular carcinoma which was encapsulated.  1.7 cm in greatest dimension.  Angioinvasion was present but Case discussed in tumor board and was found to have 2 foci of angioinvasion.  Lymphatic invasion not identified.  Extranodal extension not identified.  Margins negative.  Lymph nodes not sampled.  pT1b. patient referred for further management.  No family history of known malignancies.  Patient currently reports a dry cough that has been ongoing since her surgeryAnd she has further follow-up as well as chest x-ray scheduled  ECOG PS- 0  Pain scale- 0   Review of systems- Review of Systems  Constitutional:  Negative for chills, fever, malaise/fatigue and weight loss.  HENT:  Negative for congestion, ear discharge and nosebleeds.   Eyes:  Negative for blurred vision.  Respiratory:  Positive for cough. Negative for hemoptysis, sputum production, shortness of breath and wheezing.   Cardiovascular:  Negative for chest  pain, palpitations, orthopnea and claudication.  Gastrointestinal:  Negative for abdominal pain, blood in stool, constipation, diarrhea, heartburn, melena, nausea and vomiting.  Genitourinary:  Negative for dysuria, flank pain, frequency, hematuria and urgency.  Musculoskeletal:  Negative for back pain, joint pain and myalgias.  Skin:  Negative for rash.  Neurological:  Negative for dizziness, tingling, focal weakness, seizures, weakness and headaches.  Endo/Heme/Allergies:  Does not bruise/bleed easily.  Psychiatric/Behavioral:  Negative for depression and suicidal ideas. The patient does not have insomnia.    No Known Allergies  There are no problems to display for this patient.    Past Medical History:  Diagnosis Date   Anemia    Blurred vision    Migraine    Thyroid cancer ([place])    FOLLICULAR CARCINOMA, ENCAPSULATED ANGIOINVASIVE   Thyroid nodule      Past Surgical History:  Procedure Laterality Date   OVARIAN CYST REMOVAL Right 2006   THYROIDECTOMY Right 02/26/2021   Procedure: HEMITHYROIDECTOMY WITH LARYNGEAL NERVE MONITORING;  Surgeon: Carloyn Manner, MD;  Location: [facility] ORS;  Service: ENT;  Laterality: Right;    Social History   Socioeconomic History   Marital status: Married    Spouse name: [name]   Number of children: 2   Years of education: Not on file   Highest education level: Not on file  Occupational History   Not on file  Tobacco Use   Smoking status: Former    Types: Cigarettes    Passive exposure: Never   Smokeless tobacco: Never  Vaping Use   Vaping Use: Never used  Substance and Sexual Activity   Alcohol use: Not Currently   Drug use: Never   Sexual activity: Yes  Other Topics  Concern   Not on file  Social History Narrative   Not on file   Social Determinants of Health   Financial Resource Strain: Not on file  Food Insecurity: Not on file  Transportation Needs: Not on file  Physical Activity: Not on file  Stress: Not on file   Social Connections: Not on file  Intimate Partner Violence: Not on file     History reviewed. No pertinent family history.   Current Outpatient Medications:    Calcium Carb-Cholecalciferol 600-10 MG-MCG TABS, Take 1 tablet by mouth daily in the afternoon., Disp: , Rfl:    levonorgestrel (LILETTA, 52 MG,) 20.1 MCG/DAY IUD, 1 each by Intrauterine route once., Disp: , Rfl:    loratadine (CLARITIN) 10 MG tablet, Take 10 mg by mouth as needed for allergies., Disp: , Rfl:    benzonatate (TESSALON) 100 MG capsule, Take 100 mg by mouth 3 (three) times daily., Disp: , Rfl:    ondansetron (ZOFRAN) 4 MG tablet, Take 1 tablet (4 mg total) by mouth every 8 (eight) hours as needed for nausea or vomiting. (Patient not taking: Reported on 03/13/2021), Disp: 20 tablet, Rfl: 0   oxyCODONE (ROXICODONE) 5 MG/5ML solution, Take 5 mLs (5 mg total) by mouth every 6 (six) hours as needed for severe pain. (Patient not taking: Reported on 03/13/2021), Disp: 260 mL, Rfl: 0   promethazine (PHENERGAN) 12.5 MG tablet, Take 1 tablet (12.5 mg total) by mouth every 6 (six) hours as needed for nausea or vomiting. (Patient not taking: Reported on 03/13/2021), Disp: 30 tablet, Rfl: 0   SUMAtriptan (IMITREX) 25 MG tablet, Take 25 mg by mouth every 2 (two) hours as needed for migraine. May repeat in 2 hours if headache persists or recurs. (Patient not taking: Reported on 03/13/2021), Disp: , Rfl:    Physical exam:  Vitals:   03/13/21 1100 03/13/21 1101  BP: 105/69   Pulse: 84   Resp: 16 16  Temp: 97.7 F (36.5 C)   TempSrc: Tympanic   SpO2: 97%   Weight: 147 lb (66.7 kg) 147 lb (66.7 kg)  Height: 5' 5.5" (1.664 m) 5' 5.5" (1.664 m)   Physical Exam Constitutional:      General: She is not in acute distress. Neck:     Comments: Neck scar from recent surgery has healed well Cardiovascular:     Rate and Rhythm: Normal rate and regular rhythm.     Heart sounds: Normal heart sounds.  Pulmonary:     Effort: Pulmonary effort is  normal.     Breath sounds: Normal breath sounds.  Abdominal:     General: Bowel sounds are normal.     Palpations: Abdomen is soft.  Skin:    General: Skin is warm and dry.  Neurological:     Mental Status: She is alert and oriented to person, place, and time.       CMP Latest Ref Rng & Units 03/13/2021  Glucose 70 - 99 mg/dL 96  BUN 6 - 20 mg/dL 12  Creatinine 0.44 - 1.00 mg/dL 0.66  Sodium 135 - 145 mmol/L 135  Potassium 3.5 - 5.1 mmol/L 3.6  Chloride 98 - 111 mmol/L 100  CO2 22 - 32 mmol/L 26  Calcium 8.9 - 10.3 mg/dL 8.9  Total Protein 6.5 - 8.1 g/dL 7.7  Total Bilirubin 0.3 - 1.2 mg/dL 0.8  Alkaline Phos 38 - 126 U/L 71  AST 15 - 41 U/L 16  ALT 0 - 44 U/L 40   CBC Latest  Ref Rng & Units 03/13/2021  WBC 4.0 - 10.5 K/uL 11.4(H)  Hemoglobin 12.0 - 15.0 g/dL 14.6  Hematocrit 36.0 - 46.0 % 43.3  Platelets 150 - 400 K/uL 437(H)    Assessment and plan- Patient is a 46 y.o. 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  Final pathology shows 1.7 cm differentiated follicular carcinoma that was encapsulated with negative margins.  Angioinvasion was seen in 2 foci (<4).  NX.  She therefore falls in the low risk category and does not require a completion thyroidectomy at this time as per NCCN guidelines.  I will obtain a baseline TSH today with the goal to keep the TSH in the lower half of normal.  She does not require any radioiodine treatment as well.  I will see her back in 6 months with an ultrasound neck prior and a TSH.  I have discussed her case with Dr. Pryor Ochoa as well.   Thank you for this kind referral and the opportunity to participate in the care of this patient   Visit Diagnosis 1. Follicular carcinoma of thyroid ([place])     Dr. Randa Evens, MD, MPH [facility] at [facility] [phone] 03/14/2021 9:25 AM

## 2021-09-02 ENCOUNTER — Ambulatory Visit
Admission: RE | Admit: 2021-09-02 | Discharge: 2021-09-02 | Disposition: A | Discharge status: Home / Self Care | Payer: 59 | Source: Ambulatory Visit | Attending: Oncology | Admitting: Oncology

## 2021-09-02 DIAGNOSIS — C73 Malignant neoplasm of thyroid gland: Secondary | ICD-10-CM | POA: Insufficient documentation

## 2021-09-04 ENCOUNTER — Inpatient Hospital Stay: Payer: 59 | Attending: Oncology

## 2021-09-04 ENCOUNTER — Inpatient Hospital Stay (HOSPITAL_BASED_OUTPATIENT_CLINIC_OR_DEPARTMENT_OTHER): Payer: 59 | Admitting: Oncology

## 2021-09-04 ENCOUNTER — Encounter: Payer: Self-pay | Admitting: Oncology

## 2021-09-04 VITALS — BP 122/71 | HR 64 | Temp 97.6°F | Resp 16 | Wt 153.3 lb

## 2021-09-04 DIAGNOSIS — R519 Headache, unspecified: Secondary | ICD-10-CM | POA: Insufficient documentation

## 2021-09-04 DIAGNOSIS — C73 Malignant neoplasm of thyroid gland: Secondary | ICD-10-CM | POA: Diagnosis present

## 2021-09-04 DIAGNOSIS — Z87891 Personal history of nicotine dependence: Secondary | ICD-10-CM | POA: Diagnosis not present

## 2021-09-04 DIAGNOSIS — H538 Other visual disturbances: Secondary | ICD-10-CM | POA: Insufficient documentation

## 2021-09-04 LAB — TSH: TSH: 1.851 u[IU]/mL (ref 0.350–4.500)

## 2021-09-04 NOTE — Progress Notes (Signed)
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 Pryor Ochoa for thyroid nodule.  History obtained with the help of Spanish interpreter she subsequently had an ultrasound-guided biopsy of the nodule in August 2022 which was consistent with follicular lesion of undetermined significance bedside a category 3.  She was also followed at [facility] previously.  Patient subsequently underwent lobectomy by Dr. Pryor Ochoa on 02/26/2021.  Final pathology showed follicular carcinoma which was encapsulated.  1.7 cm in greatest dimension.  Angioinvasion was present but Case discussed in tumor board and was found to have 2 foci of angioinvasion.  Lymphatic invasion not identified.  Extranodal extension not identified.  Margins negative.  Lymph nodes not sampled.  pT1b.  Interval history-history obtained with the help of Spanish interpreter.  Overall patient is doing well and denies any specific complaints at this time.  ECOG PS- 0 Pain scale- 0   Review of systems- Review of Systems  Constitutional:  Negative for chills, fever, malaise/fatigue and weight loss.  HENT:  Negative for congestion, ear discharge and nosebleeds.   Eyes:  Negative for blurred vision.  Respiratory:  Negative for cough, hemoptysis, sputum production, shortness of breath and wheezing.   Cardiovascular:  Negative for chest pain, palpitations, orthopnea and claudication.   Gastrointestinal:  Negative for abdominal pain, blood in stool, constipation, diarrhea, heartburn, melena, nausea and vomiting.  Genitourinary:  Negative for dysuria, flank pain, frequency, hematuria and urgency.  Musculoskeletal:  Negative for back pain, joint pain and myalgias.  Skin:  Negative for rash.  Neurological:  Negative for dizziness, tingling, focal weakness, seizures, weakness and headaches.  Endo/Heme/Allergies:  Does not bruise/bleed easily.  Psychiatric/Behavioral:  Negative for depression and suicidal ideas. The patient does not have insomnia.       No Known Allergies   Past Medical History:  Diagnosis Date   Anemia    Blurred vision    Migraine    Thyroid cancer ([place])    FOLLICULAR CARCINOMA, ENCAPSULATED ANGIOINVASIVE   Thyroid nodule      Past Surgical History:  Procedure Laterality Date   OVARIAN CYST REMOVAL Right 2006   THYROIDECTOMY Right 02/26/2021   Procedure: HEMITHYROIDECTOMY WITH LARYNGEAL NERVE MONITORING;  Surgeon: Carloyn Manner, MD;  Location: [facility] ORS;  Service: ENT;  Laterality: Right;    Social History   Socioeconomic History   Marital status: Married    Spouse name: Donna Maynard   Number of children: 2   Years of education: Not on file   Highest education level: Not on file  Occupational History   Not on file  Tobacco Use   Smoking status: Former    Types: Cigarettes    Passive exposure: Never   Smokeless tobacco: Never  Vaping Use   Vaping Use: Never used  Substance and Sexual Activity   Alcohol use: Not Currently   Drug use: Never   Sexual activity: Yes  Other Topics Concern   Not on file  Social History Narrative   Not on file  Social Determinants of Health   Financial Resource Strain: Not on file  Food Insecurity: Not on file  Transportation Needs: Not on file  Physical Activity: Not on file  Stress: Not on file  Social Connections: Not on file  Intimate Partner Violence: Not on file    History reviewed. No  pertinent family history.   Current Outpatient Medications:    acetaminophen (TYLENOL) 325 MG tablet, Take 650 mg by mouth every 6 (six) hours as needed., Disp: , Rfl:    Calcium Carb-Cholecalciferol 600-10 MG-MCG TABS, Take 1 tablet by mouth daily in the afternoon., Disp: , Rfl:    levonorgestrel (LILETTA, 52 MG,) 20.1 MCG/DAY IUD, 1 each by Intrauterine route once., Disp: , Rfl:    loratadine (CLARITIN) 10 MG tablet, Take 10 mg by mouth as needed for allergies., Disp: , Rfl:    benzonatate (TESSALON) 100 MG capsule, Take 100 mg by mouth 3 (three) times daily. (Patient not taking: Reported on 09/04/2021), Disp: , Rfl:    ondansetron (ZOFRAN) 4 MG tablet, Take 1 tablet (4 mg total) by mouth every 8 (eight) hours as needed for nausea or vomiting. (Patient not taking: Reported on 03/13/2021), Disp: 20 tablet, Rfl: 0   oxyCODONE (ROXICODONE) 5 MG/5ML solution, Take 5 mLs (5 mg total) by mouth every 6 (six) hours as needed for severe pain. (Patient not taking: Reported on 03/13/2021), Disp: 260 mL, Rfl: 0   promethazine (PHENERGAN) 12.5 MG tablet, Take 1 tablet (12.5 mg total) by mouth every 6 (six) hours as needed for nausea or vomiting. (Patient not taking: Reported on 03/13/2021), Disp: 30 tablet, Rfl: 0   SUMAtriptan (IMITREX) 25 MG tablet, Take 25 mg by mouth every 2 (two) hours as needed for migraine. May repeat in 2 hours if headache persists or recurs. (Patient not taking: Reported on 03/13/2021), Disp: , Rfl:   Physical exam:  Vitals:   09/04/21 1047  BP: 122/71  Pulse: 64  Resp: 16  Temp: 97.6 F (36.4 C)  SpO2: 100%  Weight: 153 lb 4.8 oz (69.5 kg)   Physical Exam Constitutional:      General: She is not in acute distress. Cardiovascular:     Rate and Rhythm: Normal rate and regular rhythm.     Heart sounds: Normal heart sounds.  Pulmonary:     Effort: Pulmonary effort is normal.     Breath sounds: Normal breath sounds.  Abdominal:     General: Bowel sounds are normal.      Palpations: Abdomen is soft.  Lymphadenopathy:     Comments: No palpable cervical adenopathy  Skin:    General: Skin is warm and dry.  Neurological:     Mental Status: She is alert and oriented to person, place, and time.         Latest Ref Rng & Units 03/13/2021   11:47 AM  CMP  Glucose 70 - 99 mg/dL 96   BUN 6 - 20 mg/dL 12   Creatinine 0.44 - 1.00 mg/dL 0.66   Sodium 135 - 145 mmol/L 135   Potassium 3.5 - 5.1 mmol/L 3.6   Chloride 98 - 111 mmol/L 100   CO2 22 - 32 mmol/L 26   Calcium 8.9 - 10.3 mg/dL 8.9   Total Protein 6.5 - 8.1 g/dL 7.7   Total Bilirubin 0.3 - 1.2 mg/dL 0.8   Alkaline Phos 38 - 126 U/L 71   AST 15 - 41 U/L 16   ALT 0 - 44 U/L 40  Latest Ref Rng & Units 03/13/2021   11:47 AM  CBC  WBC 4.0 - 10.5 K/uL 11.4   Hemoglobin 12.0 - 15.0 g/dL 14.6   Hematocrit 36.0 - 46.0 % 43.3   Platelets 150 - 400 K/uL 437     No images are attached to the encounter.  US THYROID  Result Date: 09/02/2021 CLINICAL DATA:  46 year old female with history of follicular carcinoma the thyroid status post right hemithyroidectomy in 2022. EXAM: THYROID ULTRASOUND TECHNIQUE: Ultrasound examination of the thyroid gland and adjacent soft tissues was performed. COMPARISON:  05/30/2020 FINDINGS: Parenchymal Echotexture: Normal Isthmus: 0.3 cm ,previously 0.4 cm Right lobe: Surgically absent.  No soft tissue in the surgical bed. Left lobe: 3.6 x 1.2 x 0.9 cm ,previously 3.7 x 1.1 x 1.0 cm ________________________________________________________ Estimated total number of nodules >/= 1 cm: 0 Number of spongiform nodules >/=  2 cm not described below (TR1): 0 Number of mixed cystic and solid nodules >/= 1.5 cm not described below ([place]): 0 _________________________________________________________ No discrete nodules are seen within the remaining thyroid gland. No cervical lymphadenopathy. IMPRESSION: Postsurgical changes after right hemithyroidectomy without evidence of local recurrence or  regional lymphadenopathy. No new discrete thyroid nodules. The above is in keeping with the ACR TI-RADS recommendations - J Am Coll Radiol 2017;14:587-595. Ruthann Cancer, MD [facility] Electronically Signed   By: Ruthann Cancer M.D.   On: 09/02/2021 15:28     Assessment and plan- Patient is a 46 y.o. female with history of follicular carcinoma of the thyroid T1BNX here for routine follow-up  Clinically patient is doing well with no concerning signs and symptoms of recurrence based on today's exam. Recent ultrasound thyroid showed no evidence of recurrence.  Stable postoperative changes.  TSH from today is pending.  I will see her back in 1 year with CBC with differential CMP TSH and ultrasound thyroid prior   Visit Diagnosis 1. Follicular carcinoma of thyroid ([place])      Dr. Randa Evens, MD, MPH [facility] [phone] 09/04/2021 1:41 PM

## 2021-09-04 NOTE — Progress Notes (Signed)
Pt states at times she expereiences severe pains on the rt side of her neck and will radiate around her head. Will like tylenol when needed.

## 2022-05-06 ENCOUNTER — Other Ambulatory Visit: Payer: Self-pay

## 2022-05-06 ENCOUNTER — Encounter: Payer: Self-pay | Admitting: *Deleted

## 2022-05-06 ENCOUNTER — Emergency Department
Admission: EM | Admit: 2022-05-06 | Discharge: 2022-05-06 | Disposition: A | Discharge status: Home / Self Care | Payer: 59 | Attending: Emergency Medicine | Admitting: Emergency Medicine

## 2022-05-06 DIAGNOSIS — R21 Rash and other nonspecific skin eruption: Secondary | ICD-10-CM | POA: Diagnosis not present

## 2022-05-06 LAB — GROUP A STREP BY PCR: Group A Strep by PCR: NOT DETECTED

## 2022-05-06 MED ORDER — DIPHENHYDRAMINE HCL 50 MG/ML IJ SOLN: 50.0000 mg | Freq: Once | INTRAMUSCULAR | Status: DC

## 2022-05-06 MED ORDER — PREDNISONE 20 MG PO TABS
60.0000 mg | ORAL_TABLET | Freq: Once | ORAL | Status: AC
  Administered 2022-05-06: 60 mg via ORAL
  Filled 2022-05-06: qty 3

## 2022-05-06 MED ORDER — DIPHENHYDRAMINE HCL 25 MG PO CAPS
25.0000 mg | ORAL_CAPSULE | Freq: Four times a day (QID) | ORAL | 0 refills | Status: AC | PRN
Start: 2022-05-06 — End: 2023-09-15

## 2022-05-06 MED ORDER — FAMOTIDINE 20 MG PO TABS: 20.0000 mg | ORAL_TABLET | Freq: Two times a day (BID) | ORAL | 1 refills | Status: AC

## 2022-05-06 MED ORDER — FAMOTIDINE 20 MG PO TABS
40.0000 mg | ORAL_TABLET | Freq: Once | ORAL | Status: AC
  Administered 2022-05-06: 40 mg via ORAL
  Filled 2022-05-06: qty 2

## 2022-05-06 MED ORDER — PREDNISONE 50 MG PO TABS: ORAL_TABLET | ORAL | 0 refills | Status: AC

## 2022-05-06 MED ORDER — DIPHENHYDRAMINE HCL 25 MG PO CAPS
50.0000 mg | ORAL_CAPSULE | Freq: Once | ORAL | Status: AC
  Administered 2022-05-06: 50 mg via ORAL
  Filled 2022-05-06: qty 2

## 2022-05-06 NOTE — ED Provider Notes (Signed)
[facility] Provider Note  Patient Contact: 9:13 PM (approximate)   History   No chief complaint on file.   HPI  Donna Maynard is a 47 y.o. female presents to the emergency department with an erythematous confluent rash that developed this morning.  Patient denies other constitutional symptoms and states the rash is not pruritic.  No new medicines.  No chest pain, chest tightness or abdominal pain.  No vomiting or diarrhea.  No wheezing or cough at home.      Physical Exam   Triage Vital Signs: ED Triage Vitals  Enc Vitals Group     BP 05/06/22 1940 115/65     Pulse Rate 05/06/22 1940 77     Resp 05/06/22 1940 18     Temp 05/06/22 1940 98.9 F (37.2 C)     Temp Source 05/06/22 1940 Oral     SpO2 05/06/22 1940 100 %     Weight 05/06/22 1938 152 lb 1.9 oz (69 kg)     Height 05/06/22 1938 5\' 5"  (1.651 m)     Head Circumference --      Peak Flow --      Pain Score 05/06/22 1938 0     Pain Loc --      Pain Edu? --      Excl. in [place]? --     Most recent vital signs: Vitals:   05/06/22 1940 05/06/22 2237  BP: 115/65 112/62  Pulse: 77 80  Resp: 18 20  Temp: 98.9 F (37.2 C) 98.7 F (37.1 C)  SpO2: 100% 99%     General: Alert and in no acute distress. Eyes:  PERRL. EOMI. Head: No acute traumatic findings ENT:      Nose: No congestion/rhinnorhea.      Mouth/Throat: Mucous membranes are moist.  Neck: No stridor. No cervical spine tenderness to palpation. Cardiovascular:  Good peripheral perfusion Respiratory: Normal respiratory effort without tachypnea or retractions. Lungs CTAB. Good air entry to the bases with no decreased or absent breath sounds. Gastrointestinal: Bowel sounds 4 quadrants. Soft and nontender to palpation. No guarding or rigidity. No palpable masses. No distention. No CVA tenderness. Musculoskeletal: Full range of motion to all extremities.  Neurologic:  No gross focal neurologic deficits are appreciated.   Skin: Patient has erythematous, macular, confluent rash of upper extremities and trunk.    ED Results / Procedures / Treatments   Labs (all labs ordered are listed, but only abnormal results are displayed) Labs Reviewed  GROUP A STREP BY PCR     PROCEDURES:  Critical Care performed: No  Procedures   MEDICATIONS ORDERED IN ED: Medications  famotidine (PEPCID) tablet 40 mg (40 mg Oral Given 05/06/22 2142)  predniSONE (DELTASONE) tablet 60 mg (60 mg Oral Given 05/06/22 2142)  diphenhydrAMINE (BENADRYL) capsule 50 mg (50 mg Oral Given 05/06/22 2142)     IMPRESSION / MDM / ASSESSMENT AND PLAN / ED COURSE  I reviewed the triage vital signs and the nursing notes.                              Assessment and plan: Rash:  47 year old female presents to the emergency department with a macular, erythematous rash.  Vital signs are reassuring at triage.  On exam, patient was alert, active and nontoxic-appearing.  Group A strep testing was negative.  Patient was given Benadryl, Pepcid and prednisone in the emergency department and her symptoms  improved.  Patient was discharged with aforementioned medications and return precautions were given to return with new or worsening symptoms.  FINAL CLINICAL IMPRESSION(S) / ED DIAGNOSES   Final diagnoses:  Rash     Rx / DC Orders   ED Discharge Orders          Ordered    predniSONE (DELTASONE) 50 MG tablet        05/06/22 2228    diphenhydrAMINE (BENADRYL ALLERGY) 25 mg capsule  Every 6 hours PRN        05/06/22 2228    famotidine (PEPCID) 20 MG tablet  2 times daily        05/06/22 2228             Note:  This document was prepared using Dragon voice recognition software and may include unintentional dictation errors.   Vallarie Mare [place], PA-C 05/06/22 2301    Harvest Dark, MD 05/06/22 [phone]

## 2022-05-06 NOTE — ED Triage Notes (Signed)
Pt red rash all over her body for 1 day.  No itching  no resp distress.  No otc meds.  Pt alert.

## 2022-05-06 NOTE — Discharge Instructions (Addendum)
Take prednisone once daily for the next 5 days. Take 40 mg of Pepcid once daily for the next 5 days. You can take 25 mg of Benadryl every 8 hours for the next 3 days.

## 2022-09-05 ENCOUNTER — Inpatient Hospital Stay: Payer: 59 | Attending: Oncology

## 2022-09-05 ENCOUNTER — Ambulatory Visit
Admission: RE | Admit: 2022-09-05 | Discharge: 2022-09-05 | Disposition: A | Discharge status: Home / Self Care | Payer: 59 | Source: Ambulatory Visit | Attending: Oncology | Admitting: Oncology

## 2022-09-05 DIAGNOSIS — Z87891 Personal history of nicotine dependence: Secondary | ICD-10-CM | POA: Insufficient documentation

## 2022-09-05 DIAGNOSIS — C73 Malignant neoplasm of thyroid gland: Secondary | ICD-10-CM | POA: Insufficient documentation

## 2022-09-05 DIAGNOSIS — Z85850 Personal history of malignant neoplasm of thyroid: Secondary | ICD-10-CM | POA: Insufficient documentation

## 2022-09-05 LAB — CBC WITH DIFFERENTIAL/PLATELET
Abs Immature Granulocytes: 0.02 10*3/uL (ref 0.00–0.07)
Basophils Absolute: 0 10*3/uL (ref 0.0–0.1)
Basophils Relative: 0 %
Eosinophils Absolute: 0.1 10*3/uL (ref 0.0–0.5)
Eosinophils Relative: 1 %
HCT: 41.2 % (ref 36.0–46.0)
Hemoglobin: 14.2 g/dL (ref 12.0–15.0)
Immature Granulocytes: 0 %
Lymphocytes Relative: 22 %
Lymphs Abs: 1.5 10*3/uL (ref 0.7–4.0)
MCH: 29.5 pg (ref 26.0–34.0)
MCHC: 34.5 g/dL (ref 30.0–36.0)
MCV: 85.5 fL (ref 80.0–100.0)
Monocytes Absolute: 0.5 10*3/uL (ref 0.1–1.0)
Monocytes Relative: 7 %
Neutro Abs: 4.5 10*3/uL (ref 1.7–7.7)
Neutrophils Relative %: 70 %
Platelets: 291 10*3/uL (ref 150–400)
RBC: 4.82 MIL/uL (ref 3.87–5.11)
RDW: 12 % (ref 11.5–15.5)
WBC: 6.5 10*3/uL (ref 4.0–10.5)
nRBC: 0 % (ref 0.0–0.2)

## 2022-09-05 LAB — TSH: TSH: 1.382 u[IU]/mL (ref 0.350–4.500)

## 2022-09-05 LAB — COMPREHENSIVE METABOLIC PANEL
ALT: 15 U/L (ref 0–44)
AST: 18 U/L (ref 15–41)
Albumin: 4.2 g/dL (ref 3.5–5.0)
Alkaline Phosphatase: 72 U/L (ref 38–126)
Anion gap: 8 (ref 5–15)
BUN: 12 mg/dL (ref 6–20)
CO2: 25 mmol/L (ref 22–32)
Calcium: 9 mg/dL (ref 8.9–10.3)
Chloride: 103 mmol/L (ref 98–111)
Creatinine, Ser: 0.67 mg/dL (ref 0.44–1.00)
GFR, Estimated: >60 mL/min (ref >60)
Glucose, Bld: 93 mg/dL (ref 70–99)
Potassium: 3.7 mmol/L (ref 3.5–5.1)
Sodium: 136 mmol/L (ref 135–145)
Total Bilirubin: 1.7 mg/dL — ABNORMAL HIGH (ref 0.3–1.2)
Total Protein: 8.1 g/dL (ref 6.5–8.1)

## 2022-09-10 ENCOUNTER — Other Ambulatory Visit: Payer: Self-pay | Admitting: *Deleted

## 2022-09-10 DIAGNOSIS — C73 Malignant neoplasm of thyroid gland: Secondary | ICD-10-CM

## 2022-09-12 ENCOUNTER — Other Ambulatory Visit: Payer: Self-pay | Admitting: *Deleted

## 2022-09-19 ENCOUNTER — Other Ambulatory Visit: Payer: 59

## 2022-09-19 ENCOUNTER — Ambulatory Visit: Payer: 59 | Admitting: Oncology

## 2022-10-16 ENCOUNTER — Other Ambulatory Visit: Payer: Self-pay | Admitting: *Deleted

## 2022-10-16 DIAGNOSIS — C73 Malignant neoplasm of thyroid gland: Secondary | ICD-10-CM

## 2023-05-26 IMAGING — CR DG CHEST 2V
1 series · 2 of 2 positions shown · non-contrast
Comparison: None.

CLINICAL DATA: Cough

EXAM:
CHEST - 2 VIEW

[Series 1: dg chest 2 view · 0.14mm/px · 2 of 2 slices shown]
[im 1/2]
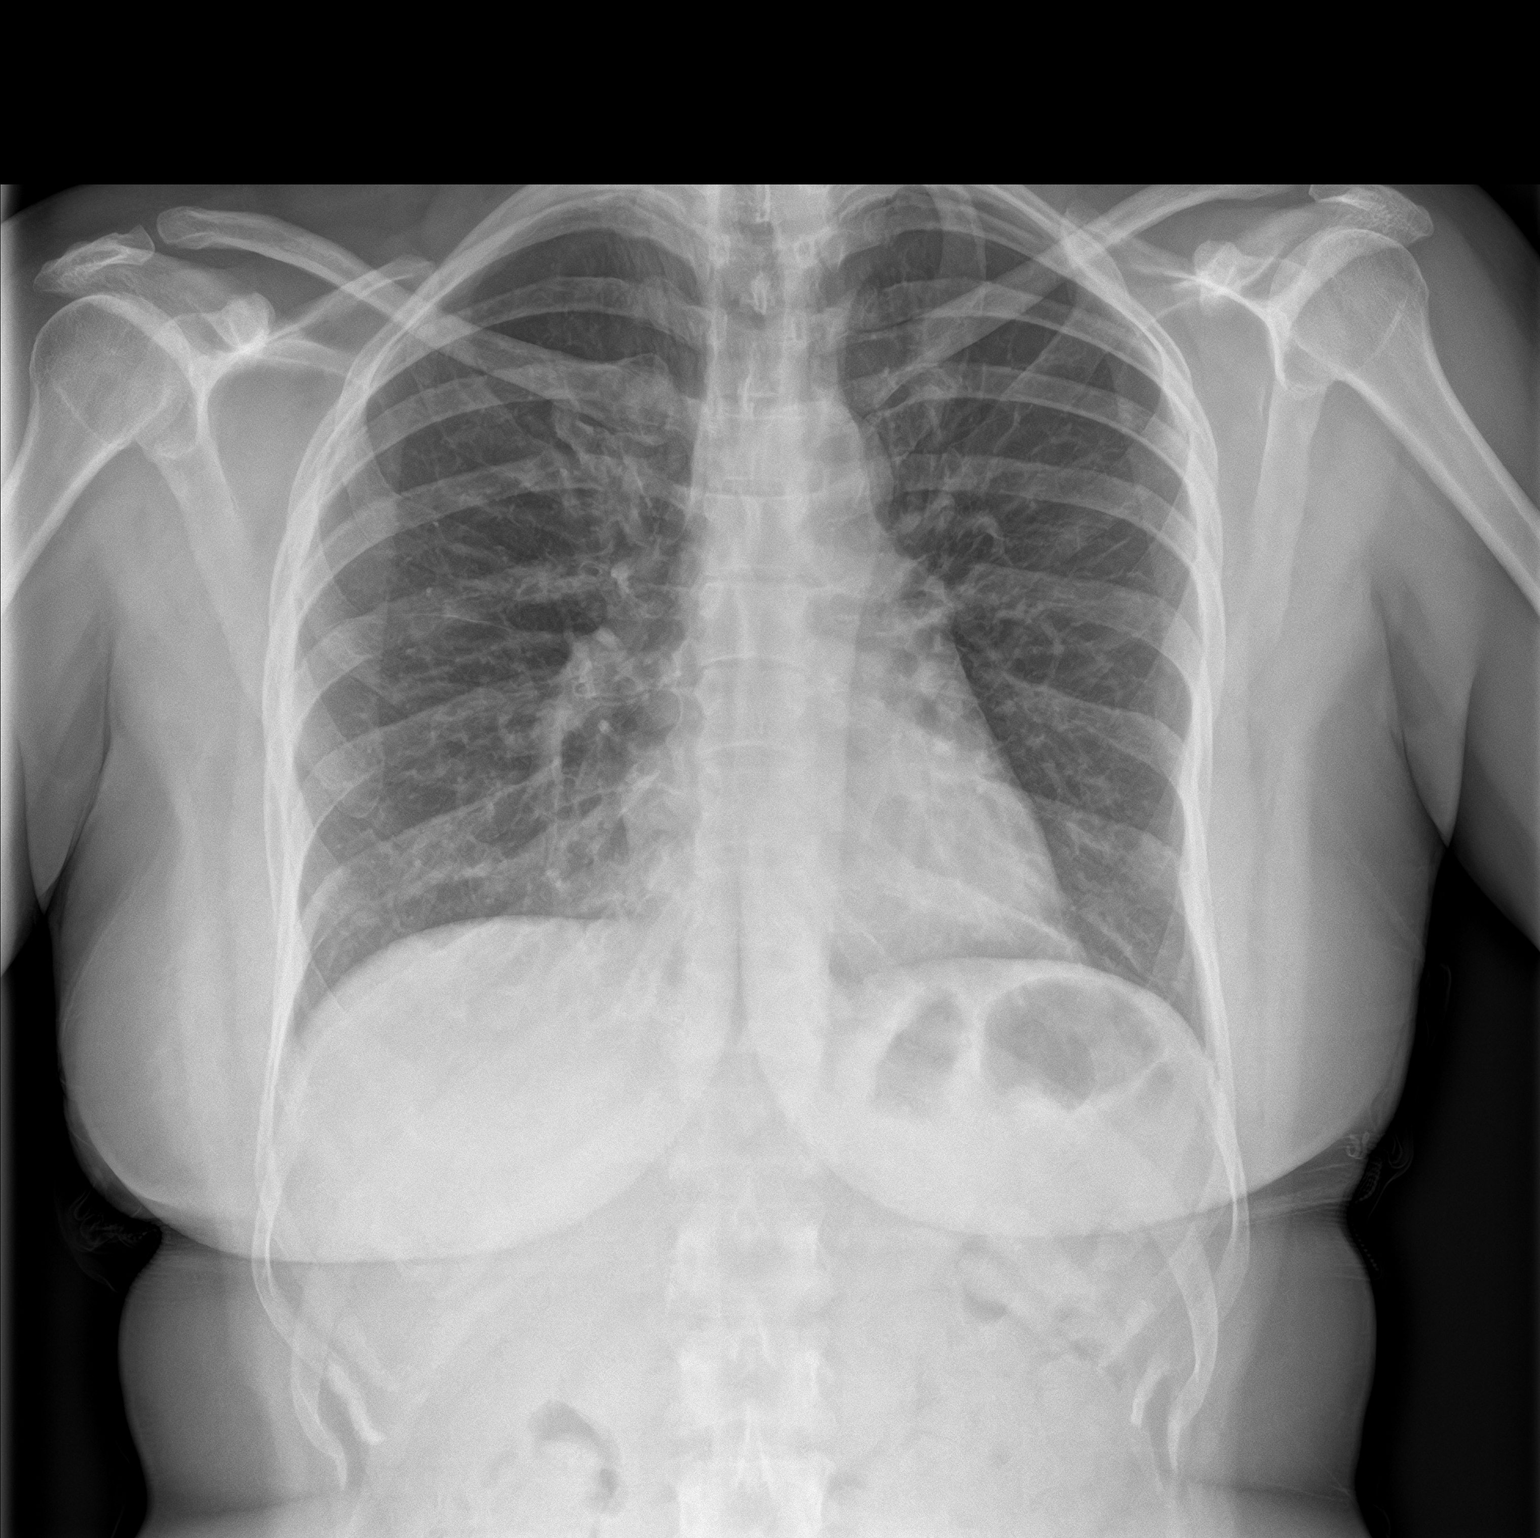
[im 2/2]
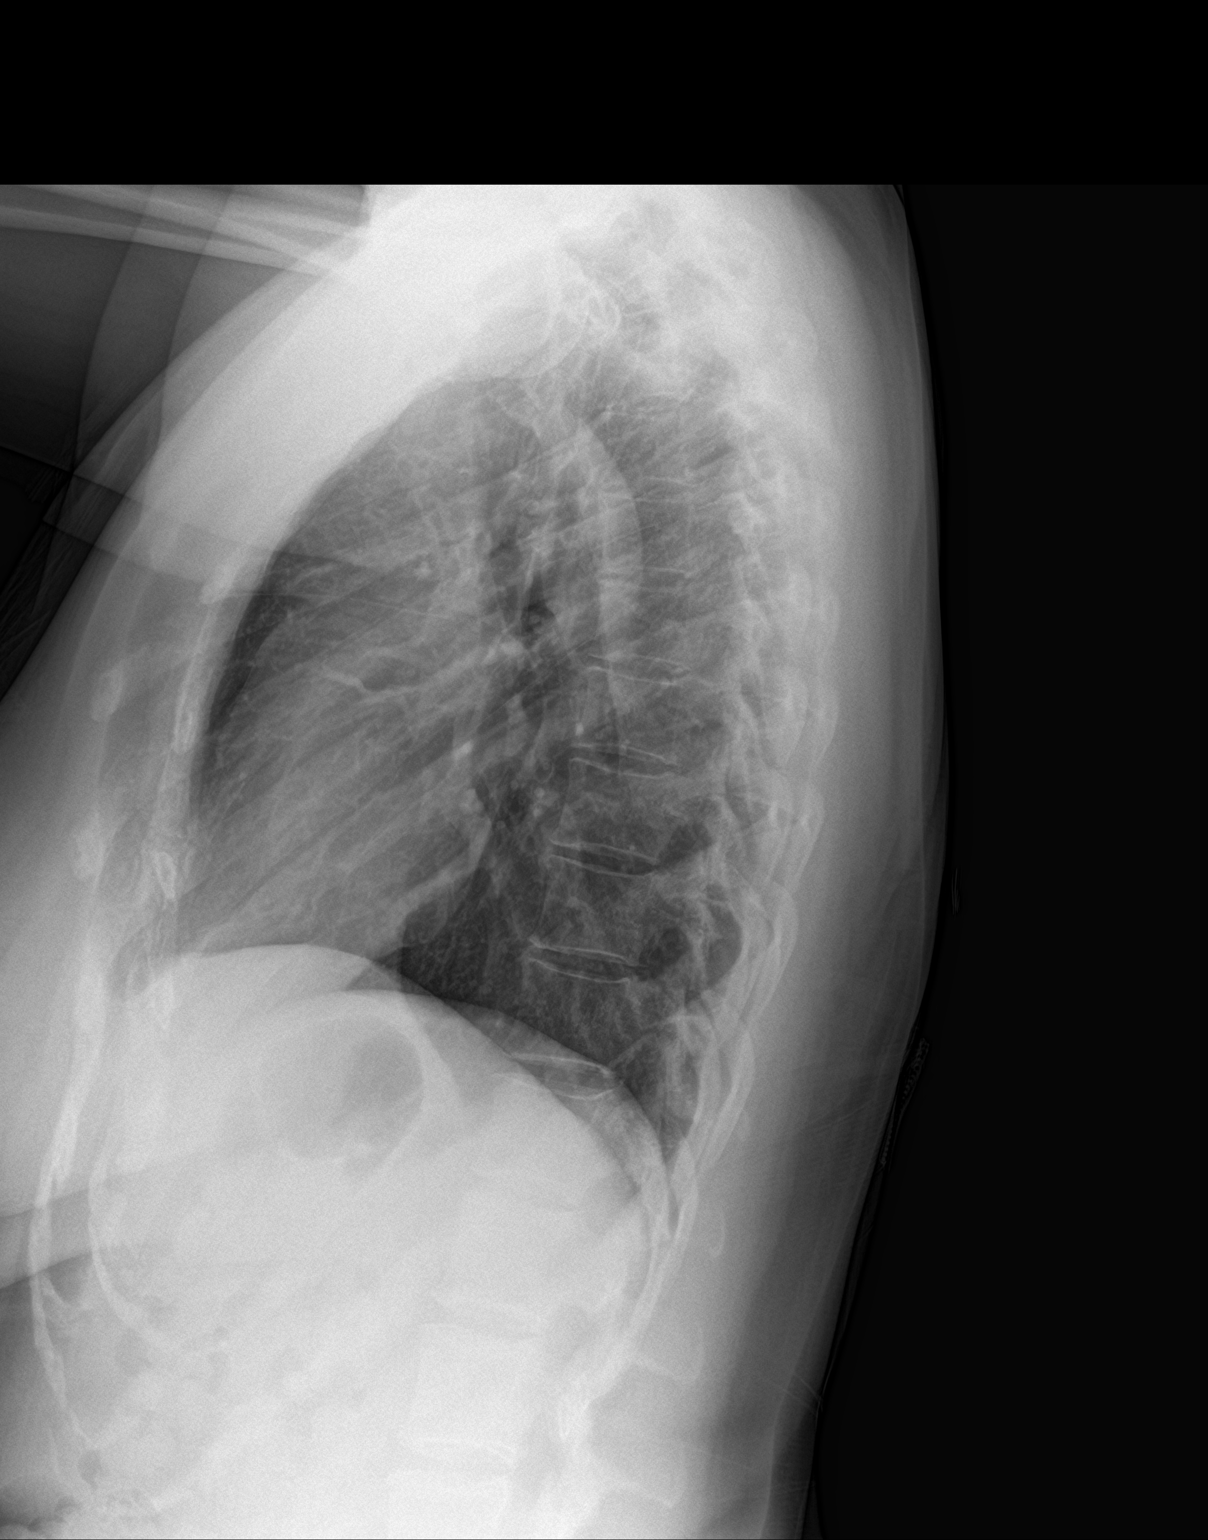

[2 of 2 positions shown; findings below may reference images not displayed]

FINDINGS: The heart size and mediastinal contours are within normal limits.
Both lungs are clear. The visualized skeletal structures are
unremarkable.
IMPRESSION: No active cardiopulmonary disease.

## 2023-09-15 ENCOUNTER — Inpatient Hospital Stay: Payer: 59 | Admitting: Oncology

## 2023-09-15 ENCOUNTER — Encounter: Payer: Self-pay | Admitting: Oncology

## 2023-09-15 ENCOUNTER — Inpatient Hospital Stay: Payer: 59 | Attending: Oncology

## 2023-09-15 VITALS — BP 118/71 | HR 74 | Temp 97.3°F | Resp 18 | Ht 65.0 in | Wt 164.7 lb

## 2023-09-15 DIAGNOSIS — C73 Malignant neoplasm of thyroid gland: Secondary | ICD-10-CM

## 2023-09-15 DIAGNOSIS — Z87891 Personal history of nicotine dependence: Secondary | ICD-10-CM | POA: Insufficient documentation

## 2023-09-15 DIAGNOSIS — Z08 Encounter for follow-up examination after completed treatment for malignant neoplasm: Secondary | ICD-10-CM | POA: Insufficient documentation

## 2023-09-15 DIAGNOSIS — Z85850 Personal history of malignant neoplasm of thyroid: Secondary | ICD-10-CM | POA: Insufficient documentation

## 2023-09-15 DIAGNOSIS — Z9089 Acquired absence of other organs: Secondary | ICD-10-CM | POA: Insufficient documentation

## 2023-09-15 LAB — COMPREHENSIVE METABOLIC PANEL WITH GFR
ALT: 23 U/L (ref 0–44)
AST: 22 U/L (ref 15–41)
Albumin: 4 g/dL (ref 3.5–5.0)
Alkaline Phosphatase: 92 U/L (ref 38–126)
Anion gap: 8 (ref 5–15)
BUN: 8 mg/dL (ref 6–20)
CO2: 23 mmol/L (ref 22–32)
Calcium: 9 mg/dL (ref 8.9–10.3)
Chloride: 103 mmol/L (ref 98–111)
Creatinine, Ser: 0.61 mg/dL (ref 0.44–1.00)
GFR, Estimated: >60 mL/min (ref >60)
Glucose, Bld: 108 mg/dL — ABNORMAL HIGH (ref 70–99)
Potassium: 3.6 mmol/L (ref 3.5–5.1)
Sodium: 134 mmol/L — ABNORMAL LOW (ref 135–145)
Total Bilirubin: 0.9 mg/dL (ref 0.0–1.2)
Total Protein: 8 g/dL (ref 6.5–8.1)

## 2023-09-15 LAB — CBC WITH DIFFERENTIAL/PLATELET
Abs Immature Granulocytes: 0.04 K/uL (ref 0.00–0.07)
Basophils Absolute: 0 K/uL (ref 0.0–0.1)
Basophils Relative: 0 %
Eosinophils Absolute: 0.1 K/uL (ref 0.0–0.5)
Eosinophils Relative: 1 %
HCT: 39.9 % (ref 36.0–46.0)
Hemoglobin: 13.6 g/dL (ref 12.0–15.0)
Immature Granulocytes: 1 %
Lymphocytes Relative: 14 %
Lymphs Abs: 1.2 K/uL (ref 0.7–4.0)
MCH: 29.2 pg (ref 26.0–34.0)
MCHC: 34.1 g/dL (ref 30.0–36.0)
MCV: 85.6 fL (ref 80.0–100.0)
Monocytes Absolute: 0.5 K/uL (ref 0.1–1.0)
Monocytes Relative: 7 %
Neutro Abs: 6.3 K/uL (ref 1.7–7.7)
Neutrophils Relative %: 77 %
Platelets: 316 K/uL (ref 150–400)
RBC: 4.66 MIL/uL (ref 3.87–5.11)
RDW: 12.3 % (ref 11.5–15.5)
WBC: 8.1 K/uL (ref 4.0–10.5)
nRBC: 0 % (ref 0.0–0.2)

## 2023-09-15 LAB — TSH: TSH: 2.031 u[IU]/mL (ref 0.350–4.500)

## 2023-09-15 NOTE — Progress Notes (Signed)
 Hematology/Oncology Consult note [facility]  Telephone:([phone] [phone]  Patient Care Team: Jacques Arlon PARAS, MD as PCP - General Milissa Hamming, MD as Referring Physician (Otolaryngology) Melanee Annah BROCKS, MD as Consulting Physician (Oncology)   Name of the patient: Donna Maynard  [id number]  1975-06-18   Date of visit: 09/15/23  Diagnosis- follicular carcinoma of the thyroid  pT1b NX M0   Chief complaint/ Reason for visit-routine follow-up of follicular carcinoma of the thyroid   Heme/Onc history: Patient is a 48 year old Hispanic female who was referred to Dr. Milissa for thyroid  nodule.  History obtained with the help of Spanish interpreter she subsequently had an ultrasound-guided biopsy of the nodule in August 2022 which was consistent with follicular lesion of undetermined significance bedside a category 3.  She was also followed at [facility] previously.  Patient subsequently underwent lobectomy by Dr. Milissa on 02/26/2021.  Final pathology showed follicular carcinoma which was encapsulated.  1.7 cm in greatest dimension.  Angioinvasion was present but Case discussed in tumor board and was found to have 2 foci of angioinvasion.  Lymphatic invasion not identified.  Extranodal extension not identified.  Margins negative.  Lymph nodes not sampled.  pT1b.   Interval history-history obtained with the help of Spanish interpreter.  Overall patient is doing well and denies any changes in appetite or weight.  Denies any neck pain or swelling.  ECOG PS- 0 Pain scale- 0   Review of systems- Review of Systems  Constitutional:  Negative for chills, fever, malaise/fatigue and weight loss.  HENT:  Negative for congestion, ear discharge and nosebleeds.   Eyes:  Negative for blurred vision.  Respiratory:  Negative for cough, hemoptysis, sputum production, shortness of breath and wheezing.   Cardiovascular:  Negative for chest pain, palpitations,  orthopnea and claudication.  Gastrointestinal:  Negative for abdominal pain, blood in stool, constipation, diarrhea, heartburn, melena, nausea and vomiting.  Genitourinary:  Negative for dysuria, flank pain, frequency, hematuria and urgency.  Musculoskeletal:  Negative for back pain, joint pain and myalgias.  Skin:  Negative for rash.  Neurological:  Negative for dizziness, tingling, focal weakness, seizures, weakness and headaches.  Endo/Heme/Allergies:  Does not bruise/bleed easily.  Psychiatric/Behavioral:  Negative for depression and suicidal ideas. The patient does not have insomnia.       No Known Allergies   Past Medical History:  Diagnosis Date   Anemia    Blurred vision    Migraine    Thyroid  cancer ([facility])    FOLLICULAR CARCINOMA, ENCAPSULATED ANGIOINVASIVE   Thyroid  nodule      Past Surgical History:  Procedure Laterality Date   OVARIAN CYST REMOVAL Right 2006   THYROIDECTOMY Right 02/26/2021   Procedure: HEMITHYROIDECTOMY WITH LARYNGEAL NERVE MONITORING;  Surgeon: Milissa Hamming, MD;  Location: [facility] ORS;  Service: ENT;  Laterality: Right;    Social History   Socioeconomic History   Marital status: Married    Spouse name: Gustav   Number of children: 2   Years of education: Not on file   Highest education level: Not on file  Occupational History   Not on file  Tobacco Use   Smoking status: Former    Types: Cigarettes    Passive exposure: Never   Smokeless tobacco: Never  Vaping Use   Vaping status: Never Used  Substance and Sexual Activity   Alcohol use: Not Currently   Drug use: Never   Sexual activity: Yes  Other Topics Concern   Not on file  Social History Narrative   Not on file   Social Drivers of Health   Financial Resource Strain: Not on file  Food Insecurity: Not on file  Transportation Needs: Not on file  Physical Activity: Not on file  Stress: Not on file  Social Connections: Not on file  Intimate Partner Violence: Not on file     No family history on file.   Current Outpatient Medications:    acetaminophen  (TYLENOL ) 325 MG tablet, Take 650 mg by mouth every 6 (six) hours as needed., Disp: , Rfl:    benzonatate (TESSALON) 100 MG capsule, Take 100 mg by mouth 3 (three) times daily. (Patient not taking: Reported on 09/04/2021), Disp: , Rfl:    Calcium Carb-Cholecalciferol 600-10 MG-MCG TABS, Take 1 tablet by mouth daily in the afternoon., Disp: , Rfl:    diphenhydrAMINE  (BENADRYL  ALLERGY) 25 mg capsule, Take 1 capsule (25 mg total) by mouth every 6 (six) hours as needed for up to 4 days., Disp: 30 capsule, Rfl: 0   famotidine  (PEPCID ) 20 MG tablet, Take 1 tablet (20 mg total) by mouth 2 (two) times daily for 5 days., Disp: 20 tablet, Rfl: 1   levonorgestrel (LILETTA, 52 MG,) 20.1 MCG/DAY IUD, 1 each by Intrauterine route once., Disp: , Rfl:    loratadine (CLARITIN) 10 MG tablet, Take 10 mg by mouth as needed for allergies., Disp: , Rfl:    ondansetron  (ZOFRAN ) 4 MG tablet, Take 1 tablet (4 mg total) by mouth every 8 (eight) hours as needed for nausea or vomiting. (Patient not taking: Reported on 03/13/2021), Disp: 20 tablet, Rfl: 0   oxyCODONE  (ROXICODONE ) 5 MG/5ML solution, Take 5 mLs (5 mg total) by mouth every 6 (six) hours as needed for severe pain. (Patient not taking: Reported on 03/13/2021), Disp: 260 mL, Rfl: 0   predniSONE  (DELTASONE ) 50 MG tablet, Take one tablet daily for the next five days., Disp: 5 tablet, Rfl: 0   promethazine  (PHENERGAN ) 12.5 MG tablet, Take 1 tablet (12.5 mg total) by mouth every 6 (six) hours as needed for nausea or vomiting. (Patient not taking: Reported on 03/13/2021), Disp: 30 tablet, Rfl: 0   SUMAtriptan (IMITREX) 25 MG tablet, Take 25 mg by mouth every 2 (two) hours as needed for migraine. May repeat in 2 hours if headache persists or recurs. (Patient not taking: Reported on 03/13/2021), Disp: , Rfl:   Physical exam: There were no vitals filed for this visit. Physical Exam Cardiovascular:      Rate and Rhythm: Normal rate and regular rhythm.     Heart sounds: Normal heart sounds.  Pulmonary:     Effort: Pulmonary effort is normal.     Breath sounds: Normal breath sounds.  Lymphadenopathy:     Comments: No palpable cervical or supraclavicular adenopathy  Skin:    General: Skin is warm and dry.  Neurological:     Mental Status: She is alert and oriented to person, place, and time.      I have personally reviewed labs listed below:    Latest Ref Rng & Units 09/15/2023   12:35 PM  CMP  Glucose 70 - 99 mg/dL 891   BUN 6 - 20 mg/dL 8   Creatinine 9.55 - 8.99 mg/dL 9.38   Sodium 864 - 854 mmol/L 134   Potassium 3.5 - 5.1 mmol/L 3.6   Chloride 98 - 111 mmol/L 103   CO2 22 - 32 mmol/L 23   Calcium 8.9 - 10.3 mg/dL 9.0   Total Protein 6.5 -  8.1 g/dL 8.0   Total Bilirubin 0.0 - 1.2 mg/dL 0.9   Alkaline Phos 38 - 126 U/L 92   AST 15 - 41 U/L 22   ALT 0 - 44 U/L 23       Latest Ref Rng & Units 09/15/2023   12:35 PM  CBC  WBC 4.0 - 10.5 K/uL 8.1   Hemoglobin 12.0 - 15.0 g/dL 86.3   Hematocrit 63.9 - 46.0 % 39.9   Platelets 150 - 400 K/uL 316      Assessment and plan- Patient is a 48 y.o. female with history of follicular carcinoma of the thyroid  T1b NX M0 s/p lobectomy in January 2023 here for routine follow-up  Patient had ultrasound of the neck at 6 months as well as 1 year from the time of surgery which did not show any evidence of recurrent or progressive disease.  Last ultrasound was done in August 2024.  Clinically patient is doing well with no concerning signs and symptoms of recurrence based on today's exam.  Labs are unremarkable and TSH is currently pending.  I will see her back in 1 year with labs.  Imaging would be based only on suspicious signs and symptoms   Visit Diagnosis 1. Encounter for follow-up surveillance of thyroid  cancer      Dr. Annah Skene, MD, MPH [facility] at [facility] [phone] 09/15/2023 1:07 PM

## 2023-09-15 NOTE — Progress Notes (Signed)
 Patient has no new acute or concerns at this time, doing well.

## 2023-12-01 ENCOUNTER — Emergency Department
Admission: EM | Admit: 2023-12-01 | Discharge: 2023-12-01 | Disposition: A | Discharge status: Home / Self Care | Payer: Self-pay | Attending: Emergency Medicine | Admitting: Emergency Medicine

## 2023-12-01 DIAGNOSIS — R519 Headache, unspecified: Secondary | ICD-10-CM | POA: Insufficient documentation

## 2023-12-01 MED ORDER — DIPHENHYDRAMINE HCL 50 MG/ML IJ SOLN
25.0000 mg | Freq: Once | INTRAMUSCULAR | Status: AC
  Administered 2023-12-01: 25 mg via INTRAVENOUS
  Filled 2023-12-01: qty 1

## 2023-12-01 MED ORDER — KETOROLAC TROMETHAMINE 15 MG/ML IJ SOLN
15.0000 mg | Freq: Once | INTRAMUSCULAR | Status: AC
  Administered 2023-12-01: 15 mg via INTRAVENOUS
  Filled 2023-12-01: qty 1

## 2023-12-01 MED ORDER — ONDANSETRON HCL 4 MG PO TABS: 4.0000 mg | ORAL_TABLET | Freq: Four times a day (QID) | ORAL | 0 refills | Status: AC | PRN

## 2023-12-01 MED ORDER — PROCHLORPERAZINE EDISYLATE 10 MG/2ML IJ SOLN
10.0000 mg | Freq: Once | INTRAMUSCULAR | Status: AC
  Administered 2023-12-01: 10 mg via INTRAVENOUS
  Filled 2023-12-01: qty 2

## 2023-12-01 MED ORDER — NAPROXEN 500 MG PO TABS: 500.0000 mg | ORAL_TABLET | Freq: Two times a day (BID) | ORAL | 0 refills | Status: AC | PRN

## 2023-12-01 MED ORDER — SODIUM CHLORIDE 0.9 % IV BOLUS
1000.0000 mL | Freq: Once | INTRAVENOUS | Status: AC
  Administered 2023-12-01: 1000 mL via INTRAVENOUS

## 2023-12-01 NOTE — ED Notes (Signed)
 Pt to bathroom at this time

## 2023-12-01 NOTE — ED Provider Notes (Signed)
 [facility] Provider Note    Event Date/Time   First MD Initiated Contact with Patient 12/01/23 1733     (approximate)   History   Migraine   HPI  Donna Maynard is a 48 year old female presenting to the ER for evaluation of headache. Patient reports a long-standing history of migraines.  Yesterday, patient had onset of a left-sided headache without associated visual changes.  Headache worsened today.  No associated numbness, tingling, focal weakness.  Has taken Tylenol  with limited benefit.  No recent trauma.     Physical Exam   Triage Vital Signs: ED Triage Vitals  Encounter Vitals Group     BP 12/01/23 1656 130/69     Girls Systolic BP Percentile --      Girls Diastolic BP Percentile --      Boys Systolic BP Percentile --      Boys Diastolic BP Percentile --      Pulse Rate 12/01/23 1656 66     Resp 12/01/23 1656 18     Temp 12/01/23 1656 97.9 F (36.6 C)     Temp Source 12/01/23 1656 Oral     SpO2 12/01/23 1656 100 %     Weight --      Height 12/01/23 1700 5' 5 (1.651 m)     Head Circumference --      Peak Flow --      Pain Score 12/01/23 1659 9     Pain Loc --      Pain Education --      Exclude from Growth Chart --     Most recent vital signs: Vitals:   12/01/23 1656  BP: 130/69  Pulse: 66  Resp: 18  Temp: 97.9 F (36.6 C)  SpO2: 100%     General: Awake, interactive  CV:  Good peripheral perfusion Resp:  Unlabored respirations Abd:  Nondistended.  Neuro:  Alert and oriented, normal extraocular movements, symmetric facial movement, sensation intact over bilateral upper and lower extremities with 5 out of 5 strength.  Normal finger-to-nose testing.   ED Results / Procedures / Treatments   Labs (all labs ordered are listed, but only abnormal results are displayed) Labs Reviewed - No data to display   EKG EKG independently reviewed and interpreted by myself demonstrates:    RADIOLOGY Imaging  independently reviewed and interpreted by myself demonstrates:   Formal Radiology Read:  No results found.  PROCEDURES:  Critical Care performed: No  Procedures   MEDICATIONS ORDERED IN ED: Medications  sodium chloride  0.9 % bolus 1,000 mL (1,000 mLs Intravenous New Bag/Given 12/01/23 1834)  ketorolac  (TORADOL ) 15 MG/ML injection 15 mg (15 mg Intravenous Given 12/01/23 1838)  diphenhydrAMINE  (BENADRYL ) injection 25 mg (25 mg Intravenous Given 12/01/23 1836)  prochlorperazine (COMPAZINE) injection 10 mg (10 mg Intravenous Given 12/01/23 1835)     IMPRESSION / MDM / ASSESSMENT AND PLAN / ED COURSE  I reviewed the triage vital signs and the nursing notes.  Differential diagnosis includes, but is not limited to: suspect benign headache such as migraine, no fever, focal neuro deficits, change in character of headache to suggest SAH, meningitis, or other emergenct process.   Patient's presentation is most consistent with acute illness / injury with system symptoms.  Patient presents with headache with reassuring exam.  Headache not sudden in onset and not maximal in onset.  Patient reports longstanding headache history, low suspicion emergent process.  Reassuring neurologic exam.  No indication for labs or imaging currently.  Will treat symptomatically with fluids, Toradol , Benadryl , Compazine and reevaluate. Confirmed that patient is not planning to drive home.  Patient reassessed after receiving medications and reports significant improvement in her headache.  No significant ongoing symptoms.  She is comfortable with discharge home.  Will DC with course of as needed naproxen for headaches and provide referral to neurology as she reports that she has been having ongoing migraines that she feels has been inadequately addressed by her primary care doctor. Strict return precautions provided.  Patient discharged in stable condition.  FINAL CLINICAL IMPRESSION(S) / ED DIAGNOSES   Final  diagnoses:  Acute nonintractable headache, unspecified headache type     Rx / DC Orders   ED Discharge Orders          Ordered    naproxen (NAPROSYN) 500 MG tablet  2 times daily PRN        12/01/23 1921    ondansetron  (ZOFRAN ) 4 MG tablet  Every 6 hours PRN        12/01/23 1921             Note:  This document was prepared using Dragon voice recognition software and may include unintentional dictation errors.   Levander Slate, MD 12/01/23 [facility]

## 2023-12-01 NOTE — ED Triage Notes (Signed)
 Pt presents to the ED via POV from home with left sided migraine that started yesterday at 12am Monday morning. Pt reports dizziness yesterday, but denies dizziness at this time. Pt denies vision problems. Reports hx of migraines, but states that this is particularly worse. Pt states that she typically has unilateral migraines, but this is the worst pain that she has ever had. Denies weakness.

## 2023-12-01 NOTE — Discharge Instructions (Signed)
 You were seen in the ER today for evaluation of your headache. Your exam here was fortunately reassuring. Please arrange follow-up with a primary care doctor within the next few days for reevaluation if your symptoms have not improved.  I have also included information for follow-up with neurologist as needed.  Return to the ER if you develop new or worsening headache, fever, neck stiffness, changes in vision, difficulty walking, weakness, dizziness, confusion, vomiting, numbness, tingling, or any other new or concerning symptoms that you believe warrants immediate attention.   I sent a prescription naproxen that you can take as needed for your headaches.  I also sent a prescription for nausea medicine that you can take as needed.

## 2023-12-01 NOTE — ED Notes (Signed)
 IV started and medication given after patient went to the BR. Tolerated  Resting with eys closed with side rails up.

## 2023-12-01 NOTE — ED Notes (Signed)
 Headache for 2 days. Point to left side where it hursts. Rates 9. States been vomiting at home per husband.

## 2024-09-14 ENCOUNTER — Ambulatory Visit: Payer: Self-pay | Admitting: Oncology

## 2024-09-14 ENCOUNTER — Other Ambulatory Visit: Payer: Self-pay
